# Patient Record
Sex: Female | Born: 1985 | Race: White | Hispanic: No | Marital: Single | Smoking: Never smoker
Health system: Southern US, Community
[De-identification: ages and names within clinical notes are randomized; demographics above are authoritative.]

## PROBLEM LIST (undated history)

## (undated) DIAGNOSIS — R87629 Unspecified abnormal cytological findings in specimens from vagina: Secondary | ICD-10-CM

## (undated) DIAGNOSIS — Z8619 Personal history of other infectious and parasitic diseases: Secondary | ICD-10-CM

## (undated) DIAGNOSIS — A609 Anogenital herpesviral infection, unspecified: Secondary | ICD-10-CM

## (undated) DIAGNOSIS — I8390 Asymptomatic varicose veins of unspecified lower extremity: Secondary | ICD-10-CM

## (undated) DIAGNOSIS — R51 Headache: Secondary | ICD-10-CM

## (undated) DIAGNOSIS — K5090 Crohn's disease, unspecified, without complications: Secondary | ICD-10-CM

## (undated) DIAGNOSIS — A64 Unspecified sexually transmitted disease: Secondary | ICD-10-CM

## (undated) DIAGNOSIS — R519 Headache, unspecified: Secondary | ICD-10-CM

## (undated) DIAGNOSIS — F419 Anxiety disorder, unspecified: Secondary | ICD-10-CM

## (undated) HISTORY — DX: Anogenital herpesviral infection, unspecified: A60.9

## (undated) HISTORY — DX: Anxiety disorder, unspecified: F41.9

## (undated) HISTORY — DX: Unspecified abnormal cytological findings in specimens from vagina: R87.629

## (undated) HISTORY — DX: Headache: R51

## (undated) HISTORY — DX: Unspecified sexually transmitted disease: A64

## (undated) HISTORY — PX: ANKLE SURGERY: SHX546

## (undated) HISTORY — PX: TONSILLECTOMY: SUR1361

## (undated) HISTORY — DX: Personal history of other infectious and parasitic diseases: Z86.19

## (undated) HISTORY — DX: Headache, unspecified: R51.9

## (undated) HISTORY — DX: Asymptomatic varicose veins of unspecified lower extremity: I83.90

---

## 2009-08-30 ENCOUNTER — Emergency Department (HOSPITAL_BASED_OUTPATIENT_CLINIC_OR_DEPARTMENT_OTHER): Admission: EM | Admit: 2009-08-30 | Discharge: 2009-08-31 | Payer: Self-pay | Admitting: Emergency Medicine

## 2009-08-30 ENCOUNTER — Ambulatory Visit: Payer: Self-pay | Admitting: Diagnostic Radiology

## 2010-09-20 LAB — URINE MICROSCOPIC-ADD ON

## 2010-09-20 LAB — URINALYSIS, ROUTINE W REFLEX MICROSCOPIC
Leukocytes, UA: NEGATIVE
Specific Gravity, Urine: 1.03 (ref 1.005–1.030)
Urobilinogen, UA: 1 mg/dL (ref 0.0–1.0)

## 2010-09-20 LAB — COMPREHENSIVE METABOLIC PANEL
AST: 44 U/L — ABNORMAL HIGH (ref 0–37)
Albumin: 4.9 g/dL (ref 3.5–5.2)
Alkaline Phosphatase: 79 U/L (ref 39–117)
BUN: 10 mg/dL (ref 6–23)
Calcium: 9.5 mg/dL (ref 8.4–10.5)
GFR calc Af Amer: >60 mL/min (ref >60)
Potassium: 3.9 mEq/L (ref 3.5–5.1)
Sodium: 141 mEq/L (ref 135–145)
Total Bilirubin: 0.7 mg/dL (ref 0.3–1.2)
Total Protein: 8.3 g/dL (ref 6.0–8.3)

## 2010-09-20 LAB — DIFFERENTIAL
Basophils Absolute: 0.1 10*3/uL (ref 0.0–0.1)
Basophils Relative: 1 % (ref 0–1)
Eosinophils Absolute: 0 10*3/uL (ref 0.0–0.7)
Eosinophils Relative: 0 % (ref 0–5)

## 2010-09-20 LAB — CBC
MCV: 88.3 fL (ref 78.0–100.0)
WBC: 5.8 10*3/uL (ref 4.0–10.5)

## 2010-09-20 LAB — PREGNANCY, URINE: Preg Test, Ur: NEGATIVE

## 2012-11-07 ENCOUNTER — Telehealth: Payer: Self-pay | Admitting: Nurse Practitioner

## 2012-11-07 NOTE — Telephone Encounter (Signed)
Left message to return call to our office concerning question of ocp.

## 2012-11-07 NOTE — Telephone Encounter (Signed)
Pt having problems with her birth control. Please call to advise.

## 2012-11-09 NOTE — Telephone Encounter (Signed)
Left message on CB# to return call to our office concerning question of OCP.

## 2012-11-14 ENCOUNTER — Telehealth: Payer: Self-pay | Admitting: Nurse Practitioner

## 2012-11-14 NOTE — Telephone Encounter (Signed)
Patient called concern of currently on birth control Yaz. Had read where it could cause spider veins. Last nite she notice an area on leg with pain and throbbing and today has first varicose vein. Request to change to a birth control that would not cause this to happen. Please advise.

## 2012-11-14 NOTE — Telephone Encounter (Signed)
Patient called to discuss blood clots.

## 2012-11-16 ENCOUNTER — Other Ambulatory Visit: Payer: Self-pay | Admitting: Orthopedic Surgery

## 2012-11-16 MED ORDER — DESOGESTREL-ETHINYL ESTRADIOL 0.15-0.02/0.01 MG (21/5) PO TABS: 1.0000 | ORAL_TABLET | Freq: Every day | ORAL | Status: DC

## 2012-11-16 NOTE — Telephone Encounter (Signed)
Spoke with pt about PG recommendation. Pt willing to try Mircette. Advised to try for a few months and call back if problems. Advised she may have some spotting or BTB when starting a new pill. Pt agreeable.

## 2012-11-16 NOTE — Telephone Encounter (Signed)
Any OCP can increase risk of DVT; however her complaints seem to be related to superficial varicosities. This can be related to family inherited problem or from OCP.  Unfortunately any pill may do the same thing.  Less risk factors if not smoking.   We could change and see what happens.  She has been on Aviane and had mood changes, now Yaz.  Lets try a different pill family like Mircette.  Ok to give RX until Jan 2015.  If she doesn't like this pill or problems to call back.  There is still an adjustment period when changes types of pills.

## 2012-12-07 ENCOUNTER — Ambulatory Visit (INDEPENDENT_AMBULATORY_CARE_PROVIDER_SITE_OTHER): Payer: BC Managed Care – PPO | Admitting: Obstetrics & Gynecology

## 2012-12-07 ENCOUNTER — Telehealth: Payer: Self-pay | Admitting: Nurse Practitioner

## 2012-12-07 ENCOUNTER — Encounter: Payer: Self-pay | Admitting: Obstetrics & Gynecology

## 2012-12-07 VITALS — BP 110/78 | HR 72 | Resp 16 | Ht 65.5 in | Wt 158.0 lb

## 2012-12-07 DIAGNOSIS — N760 Acute vaginitis: Secondary | ICD-10-CM

## 2012-12-07 DIAGNOSIS — N898 Other specified noninflammatory disorders of vagina: Secondary | ICD-10-CM

## 2012-12-07 DIAGNOSIS — A499 Bacterial infection, unspecified: Secondary | ICD-10-CM

## 2012-12-07 MED ORDER — METRONIDAZOLE 500 MG PO TABS: 500.0000 mg | ORAL_TABLET | Freq: Two times a day (BID) | ORAL | Status: DC

## 2012-12-07 MED ORDER — METRONIDAZOLE 0.75 % VA GEL: 1.0000 | Freq: Every day | VAGINAL | Status: DC

## 2012-12-07 MED ORDER — FLUCONAZOLE 150 MG PO TABS: 150.0000 mg | ORAL_TABLET | Freq: Once | ORAL | Status: DC

## 2012-12-07 NOTE — Telephone Encounter (Signed)
Patient has called again requesting medication for Bacterial Vaginosis. States she knows the symptoms and has history of this and requires medication for. Please advise. sue

## 2012-12-07 NOTE — Telephone Encounter (Signed)
Pt would like to have something called in for an infection if possible.

## 2012-12-07 NOTE — Progress Notes (Signed)
Subjective:     Patient ID: Chelsey Alvarado, female   DOB: 01-20-1986, 27 y.o.   MRN: [id number]  HPI 27 yo G0 with three days of increasing vaginal discharge and odor.  Pt reports long hx of recurrent BV until she started on Yaz.  She did really well on Yaz with improved acne, decreased cycles, and resolution of BV.  Reports earlier this year she started having "vein problems" caused by the Yaz.  She states her veins started to swell.  She saw provider at [facility] [place] in [place].  From her description, a doppler was done that was negative.  She is to wear compression hose for three months and then have an ultrasound done--vein mapping?  She was told the vein issues were due to the Yaz.  She wants to restart it.    In review of old paper chart, patient also had "issues" with Gardisil vaccine.  We did an adverse reaction report for her, although reaction was not felt due to Gardisil.     Review of Systems No urinary symptoms.  No fever.  No back pain.  No bowel issues.    Objective:   Physical Exam  Constitutional: She is oriented to person, place, and time. She appears well-developed and well-nourished.  Abdominal: Soft. Bowel sounds are normal.  Genitourinary: Uterus normal. There is no rash or lesion on the right labia. There is no rash or lesion on the left labia. Cervix exhibits no motion tenderness, no discharge and no friability. Right adnexum displays no mass and no tenderness. Left adnexum displays no mass and no tenderness. No bleeding around the vagina. No signs of injury around the vagina. Vaginal discharge (whitish with odor) found.  Wet smear:  Ph 4.5.  Saline with Clue cells, no trich. KOH with occasional yeast  Neurological: She is alert and oriented to person, place, and time.  Skin: Skin is warm and dry.       Assessment:     Vaginal discharge consistent with BV     Plan:     Patient requests both Oral and vaginal treatments for BV.  RX to pharmacy. Diflucan  150mg  po x 1, repeat 48 hrs if needed for yeast Request records from [facility] to review if Yaz appropriate or not safe.

## 2012-12-07 NOTE — Telephone Encounter (Signed)
Patient was upset when she was notified that we could not treat for BV without evaluation.  Last treated for BV over 1 year ago.  Despite patient insistence that she is very familiar with these symptoms, she is leaving the country at [facility] and she is advised (and confirmed with Dr Farrel Gobble) that she must be see for evaluation before treatment.  Patient states she is an hour away, it is [place], and leaves on Monday so I suggested that she could try Urgent Care.  Patient asks show long we are open and i offered appt with Dr Hyacinth Meeker for 4pm and patient states she will try to be here by then but may be late.  Advised we are working her in at the end of our day and if >15 mins late we will not be able to see her.

## 2012-12-07 NOTE — Patient Instructions (Addendum)
Please call with any new symptoms.

## 2012-12-07 NOTE — Telephone Encounter (Signed)
LMTCB 12/07/12 1:00pm cm

## 2012-12-20 ENCOUNTER — Telehealth: Payer: Self-pay | Admitting: Obstetrics & Gynecology

## 2012-12-20 NOTE — Telephone Encounter (Signed)
Chelsey Alvarado notified patient of referral appointment to [facility] sept 15 at 9:30Am, 10:30am appt for consult.  Will have venous doppler same day.  Message was left on voice mail.

## 2012-12-24 ENCOUNTER — Other Ambulatory Visit: Payer: Self-pay | Admitting: *Deleted

## 2012-12-24 DIAGNOSIS — I83893 Varicose veins of bilateral lower extremities with other complications: Secondary | ICD-10-CM

## 2013-01-08 ENCOUNTER — Other Ambulatory Visit: Payer: Self-pay | Admitting: Nurse Practitioner

## 2013-01-08 NOTE — Telephone Encounter (Signed)
Rx for Valtrex 1gm #30/12 refills, 1 po qd sent to pharmacy. pt is aware.

## 2013-01-08 NOTE — Telephone Encounter (Signed)
Yes OK to refill for 1 year at Valtrex 1 Gm daily # 30/12 or 3 months if insurance allows.  I failed to give Rx at last AEX or perhaps she did not need refill at that time.

## 2013-01-08 NOTE — Telephone Encounter (Signed)
Ok to refill valtrex 1gm until next aex (aex-07/18/2013)?  -valtrex 1gm, 1/2 tab po BID x3-5 days with outbreaks, then 1/2 tab po qd? Please advise. Chart on your door.

## 2013-03-04 ENCOUNTER — Other Ambulatory Visit: Payer: Self-pay | Admitting: Obstetrics & Gynecology

## 2013-03-05 NOTE — Telephone Encounter (Signed)
Please advise patient was here for OV 12/07/12 Diflucan 150 mg # 2/1rfs was sent to pharmacy.

## 2013-03-06 NOTE — Telephone Encounter (Signed)
Called patient LM on her vm to call us if symptoms do not resolve  Thanks!

## 2013-03-06 NOTE — Telephone Encounter (Signed)
rx to pharmacy.  If symptoms do not resolve, needs OV

## 2013-03-08 ENCOUNTER — Encounter: Payer: Self-pay | Admitting: Vascular Surgery

## 2013-03-11 ENCOUNTER — Encounter: Payer: Self-pay | Admitting: Vascular Surgery

## 2013-03-11 ENCOUNTER — Encounter (INDEPENDENT_AMBULATORY_CARE_PROVIDER_SITE_OTHER): Payer: BC Managed Care – PPO | Admitting: *Deleted

## 2013-03-11 ENCOUNTER — Ambulatory Visit (INDEPENDENT_AMBULATORY_CARE_PROVIDER_SITE_OTHER): Payer: BC Managed Care – PPO | Admitting: Vascular Surgery

## 2013-03-11 VITALS — BP 108/78 | HR 62 | Resp 18 | Ht 67.0 in | Wt 170.0 lb

## 2013-03-11 DIAGNOSIS — I83893 Varicose veins of bilateral lower extremities with other complications: Secondary | ICD-10-CM

## 2013-03-11 NOTE — Progress Notes (Signed)
Subjective:     Patient ID: Chelsey Alvarado, female   DOB: [birth year], 27 y.o.   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 She has noticed a prominent vein in the left posterior calf area which causes some aching discomfort as the day progresses. She is on her feet all day during the school. She is also noted some spider veins in both legs which are small. She has no history of DVT, thrombophlebitis, pulmonary embolus, stasis ulcers, or distal edema. She does wear short elastic compression stockings which did not relieve the symptoms related to the small varicosity.  Past Medical History  Diagnosis Date  . Varicose veins     History  Substance Use Topics  . Smoking status: Never Smoker   . Smokeless tobacco: Never Used  . Alcohol Use: 0.5 oz/week    1 drink(s) per week    Family History  Problem Relation Age of Onset  . Breast cancer Mother   . Emphysema Father   . Heart disease Father   . Cancer Maternal Grandmother   . Cancer Maternal Grandfather   . Cancer Paternal Grandmother   . Cancer Paternal Grandfather     Allergies  Allergen Reactions  . Aspirin   . Latex     Current outpatient prescriptions:ALPRAZolam (XANAX) 0.25 MG tablet, Take 0.25 mg by mouth at bedtime as needed for sleep., Disp: , Rfl: ;  Biotin 10 MG CAPS, Take by mouth., Disp: , Rfl: ;  fluconazole (DIFLUCAN) 150 MG tablet, TAKE 1 TABLET BY MOUTH ONCE, REPEAT IN 48 HOURS IF SYMPTOMS ARE NOT COMPLETELY RESOLVED., Disp: 2 tablet, Rfl: 0 metroNIDAZOLE (FLAGYL) 500 MG tablet, Take 1 tablet (500 mg total) by mouth 2 (two) times daily., Disp: 14 tablet, Rfl: 0;  metroNIDAZOLE (METROGEL) 0.75 % vaginal gel, Place 1 Applicatorful vaginally at bedtime., Disp: 70 g, Rfl: 0;  valACYclovir (VALTREX) 1000 MG tablet, TAKE 1 TABLET BY MOUTH EVERY DAY, Disp: 30 tablet, Rfl: 12  BP 108/78  Pulse 62  Resp 18  Ht 5\' 7"  (1.702 m)  Wt 170 lb (77.111 kg)   BMI 26.62 kg/m2  Body mass index is 26.62 kg/(m^2).           Review of Systems totally negative review of systems all systems reviewed and patient is asymptomatic    Objective:   Physical Exam BP 108/78  Pulse 62  Resp 18  Ht 5\' 7"  (1.702 m)  Wt 170 lb (77.111 kg)  BMI 26.62 kg/m2  Gen.-alert and oriented x3 in no apparent distress HEENT normal for age Lungs no rhonchi or wheezing Cardiovascular regular rhythm no murmurs carotid pulses 3+ palpable no bruits audible Abdomen soft nontender no palpable masses Musculoskeletal free of  major deformities Skin clear -no rashes Neurologic normal Lower extremities 3+ femoral and dorsalis pedis pulses palpable bilaterally with no edema Small reticular vein in the left posterior calf adjacent popliteal fossa with a few tiny spider veins adjacent. No bulging varicosities. No active ulceration or hyperpigmentation or significant edema.  Today I ordered bilateral venous duplex exam which are reviewed and interpreted. There is no DVT. There is a small area of reflux in the right great saphenous vein around the knee area but it is small caliber and not to be of thought of any significance. Left leg is unremarkable.        Assessment:     Reticular vein left posterior calf with no evidence of deep  or superficial venous insufficiency and a few scattered spider veins    Plan:     Have offered patient sclerotherapy for this if she should desire she will be in touch with [place]

## 2013-03-26 ENCOUNTER — Other Ambulatory Visit: Payer: Self-pay | Admitting: Nurse Practitioner

## 2013-04-01 ENCOUNTER — Telehealth: Payer: Self-pay | Admitting: Nurse Practitioner

## 2013-04-01 NOTE — Telephone Encounter (Signed)
Chief Complaint  Patient presents with  . Medication Management    pt wants to switch birth control she is unhappy with what she is taking   Last AEX with Lauro Franklin, FNP, rx Yaz on /17/14.  Message left to return call to [place] at [phone].

## 2013-04-01 NOTE — Telephone Encounter (Signed)
Patient returning Tracy's call. °

## 2013-04-02 NOTE — Telephone Encounter (Signed)
Spoke with pt who would like to switch back to [place], as it did not cause as much bloating and crankiness as her current [place] does. Pt saw Vein Specialist and asked about Yaz affecting her bad veins. He thought it was more from wt gain than the OCP. OK to switch back to [place]?

## 2013-04-02 NOTE — Telephone Encounter (Signed)
Dr. Rondel Baton last note states she was going to review the medical records from the vein clinic since they felt the problems with varicose veins maybe from the Yaz.  I would make sure we had those records and refer this question to Dr. Hyacinth Meeker since she knows about this.

## 2013-04-02 NOTE — Telephone Encounter (Signed)
Patient is returning Tracy's call from yesterday

## 2013-04-03 MED ORDER — DROSPIRENONE-ETHINYL ESTRADIOL 3-0.02 MG PO TABS: 1.0000 | ORAL_TABLET | Freq: Every day | ORAL | Status: DC

## 2013-04-03 NOTE — Telephone Encounter (Signed)
Yes.  Fine to switch back.  Vein clinic note in Epic.  Rx done.

## 2013-04-03 NOTE — Telephone Encounter (Signed)
Dr. Hyacinth Meeker can you review and advise? Patient requests to go back on [place].

## 2013-04-03 NOTE — Telephone Encounter (Signed)
Message left to advise "The Prescription that you have requested was sent to your pharmacy if you have any questions,  return call to Frederick Klinger at [phone]."  

## 2013-05-02 ENCOUNTER — Other Ambulatory Visit: Payer: Self-pay

## 2013-07-18 ENCOUNTER — Ambulatory Visit (INDEPENDENT_AMBULATORY_CARE_PROVIDER_SITE_OTHER): Payer: BC Managed Care – PPO | Admitting: Nurse Practitioner

## 2013-07-18 ENCOUNTER — Encounter: Payer: Self-pay | Admitting: Nurse Practitioner

## 2013-07-18 ENCOUNTER — Ambulatory Visit: Payer: Self-pay | Admitting: Nurse Practitioner

## 2013-07-18 VITALS — BP 100/60 | HR 64 | Ht 65.25 in | Wt 180.0 lb

## 2013-07-18 DIAGNOSIS — Z0000 Encounter for general adult medical examination without abnormal findings: Secondary | ICD-10-CM

## 2013-07-18 DIAGNOSIS — Z01419 Encounter for gynecological examination (general) (routine) without abnormal findings: Secondary | ICD-10-CM

## 2013-07-18 LAB — POCT URINALYSIS DIPSTICK
BILIRUBIN UA: NEGATIVE
Glucose, UA: NEGATIVE
Ketones, UA: NEGATIVE
Leukocytes, UA: NEGATIVE
NITRITE UA: NEGATIVE
PROTEIN UA: NEGATIVE
Urobilinogen, UA: NEGATIVE
pH, UA: 5

## 2013-07-18 LAB — HEMOGLOBIN, FINGERSTICK: HEMOGLOBIN, FINGERSTICK: 12.5 g/dL (ref 12.0–16.0)

## 2013-07-18 MED ORDER — VALACYCLOVIR HCL 1 G PO TABS: ORAL_TABLET | ORAL | Status: DC

## 2013-07-18 MED ORDER — ADAPALENE-BENZOYL PEROXIDE 0.1-2.5 % EX GEL: 1.0000 "application " | Freq: Every day | CUTANEOUS | Status: DC

## 2013-07-18 MED ORDER — DROSPIRENONE-ETHINYL ESTRADIOL 3-0.02 MG PO TABS: 1.0000 | ORAL_TABLET | Freq: Every day | ORAL | Status: DC

## 2013-07-18 NOTE — Patient Instructions (Signed)
General topics  Next pap or exam is  due in 1 year Take a Women's multivitamin Take 1200 mg. of calcium daily - prefer dietary If any concerns in interim to call back  Breast Self-Awareness Practicing breast self-awareness may pick up problems early, prevent significant medical complications, and possibly save your life. By practicing breast self-awareness, you can become familiar with how your breasts look and feel and if your breasts are changing. This allows you to notice changes early. It can also offer you some reassurance that your breast health is good. One way to learn what is normal for your breasts and whether your breasts are changing is to do a breast self-exam. If you find a lump or something that was not present in the past, it is best to contact your caregiver right away. Other findings that should be evaluated by your caregiver include nipple discharge, especially if it is bloody; skin changes or reddening; areas where the skin seems to be pulled in (retracted); or new lumps and bumps. Breast pain is seldom associated with cancer (malignancy), but should also be evaluated by a caregiver. BREAST SELF-EXAM The best time to examine your breasts is 5 7 days after your menstrual period is over.  ExitCare Patient Information 2013 [employer].   Exercise to Stay Healthy Exercise helps you become and stay healthy. EXERCISE IDEAS AND TIPS Choose exercises that:  You enjoy.  Fit into your day. You do not need to exercise really hard to be healthy. You can do exercises at a slow or medium level and stay healthy. You can:  Stretch before and after working out.  Try yoga, Pilates, or tai chi.  Lift weights.  Walk fast, swim, jog, run, climb stairs, bicycle, dance, or rollerskate.  Take aerobic classes. Exercises that burn about 150 calories:  Running 1  miles in 15 minutes.  Playing volleyball for 45 to 60 minutes.  Washing and waxing a car for 45 to 60  minutes.  Playing touch football for 45 minutes.  Walking 1  miles in 35 minutes.  Pushing a stroller 1  miles in 30 minutes.  Playing basketball for 30 minutes.  Raking leaves for 30 minutes.  Bicycling 5 miles in 30 minutes.  Walking 2 miles in 30 minutes.  Dancing for 30 minutes.  Shoveling snow for 15 minutes.  Swimming laps for 20 minutes.  Walking up stairs for 15 minutes.  Bicycling 4 miles in 15 minutes.  Gardening for 30 to 45 minutes.  Jumping rope for 15 minutes.  Washing windows or floors for 45 to 60 minutes. Document Released: 07/16/2010 Document Revised: 09/05/2011 Document Reviewed: 07/16/2010 ExitCare Patient Information 2013 [employer].   Other topics ( that may be useful information):    Sexually Transmitted Disease Sexually transmitted disease (STD) refers to any infection that is passed from person to person during sexual activity. This may happen by way of saliva, semen, blood, vaginal mucus, or urine. Common STDs include:  Gonorrhea.  Chlamydia.  Syphilis.  HIV/AIDS.  Genital herpes.  Hepatitis B and C.  Trichomonas.  Human papillomavirus (HPV).  Pubic lice. CAUSES  An STD may be spread by bacteria, virus, or parasite. A person can get an STD by:  Sexual intercourse with an infected person.  Sharing sex toys with an infected person.  Sharing needles with an infected person.  Having intimate contact with the genitals, mouth, or rectal areas of an infected person. SYMPTOMS  Some people may not have any symptoms, but   they can still pass the infection to others. Different STDs have different symptoms. Symptoms include:  Painful or bloody urination.  Pain in the pelvis, abdomen, vagina, anus, throat, or eyes.  Skin rash, itching, irritation, growths, or sores (lesions). These usually occur in the genital or anal area.  Abnormal vaginal discharge.  Penile discharge in men.  Soft, flesh-colored skin growths in the  genital or anal area.  Fever.  Pain or bleeding during sexual intercourse.  Swollen glands in the groin area.  Yellow skin and eyes (jaundice). This is seen with hepatitis. DIAGNOSIS  To make a diagnosis, your caregiver may:  Take a medical history.  Perform a physical exam.  Take a specimen (culture) to be examined.  Examine a sample of discharge under a microscope.  Perform blood test TREATMENT   Chlamydia, gonorrhea, trichomonas, and syphilis can be cured with antibiotic medicine.  Genital herpes, hepatitis, and HIV can be treated, but not cured, with prescribed medicines. The medicines will lessen the symptoms.  Genital warts from HPV can be treated with medicine or by freezing, burning (electrocautery), or surgery. Warts may come back.  HPV is a virus and cannot be cured with medicine or surgery.However, abnormal areas may be followed very closely by your caregiver and may be removed from the cervix, vagina, or vulva through office procedures or surgery. If your diagnosis is confirmed, your recent sexual partners need treatment. This is true even if they are symptom-free or have a negative culture or evaluation. They should not have sex until their caregiver says it is okay. HOME CARE INSTRUCTIONS  All sexual partners should be informed, tested, and treated for all STDs.  Take your antibiotics as directed. Finish them even if you start to feel better.  Only take over-the-counter or prescription medicines for pain, discomfort, or fever as directed by your caregiver.  Rest.  Eat a balanced diet and drink enough fluids to keep your urine clear or pale yellow.  Do not have sex until treatment is completed and you have followed up with your caregiver. STDs should be checked after treatment.  Keep all follow-up appointments, Pap tests, and blood tests as directed by your caregiver.  Only use latex condoms and water-soluble lubricants during sexual activity. Do not use  petroleum jelly or oils.  Avoid alcohol and illegal drugs.  Get vaccinated for HPV and hepatitis. If you have not received these vaccines in the past, talk to your caregiver about whether one or both might be right for you.  Avoid risky sex practices that can break the skin. The only way to avoid getting an STD is to avoid all sexual activity.Latex condoms and dental dams (for oral sex) will help lessen the risk of getting an STD, but will not completely eliminate the risk. SEEK MEDICAL CARE IF:   You have a fever.  You have any new or worsening symptoms. Document Released: 09/03/2002 Document Revised: 09/05/2011 Document Reviewed: 09/10/2010 [facility] Patient Information 2013 [employer].    Domestic Abuse You are being battered or abused if someone close to you hits, pushes, or physically hurts you in any way. You also are being abused if you are forced into activities. You are being sexually abused if you are forced to have sexual contact of any kind. You are being emotionally abused if you are made to feel worthless or if you are constantly threatened. It is important to remember that help is available. No one has the right to abuse you. PREVENTION OF FURTHER  ABUSE  Learn the warning signs of danger. This varies with situations but may include: the use of alcohol, threats, isolation from friends and family, or forced sexual contact. Leave if you feel that violence is going to occur.  If you are attacked or beaten, report it to the police so the abuse is documented. You do not have to press charges. The police can protect you while you or the attackers are leaving. Get the officer's name and badge number and a copy of the report.  Find someone you can trust and tell them what is happening to you: your caregiver, a nurse, clergy member, close friend or family member. Feeling ashamed is natural, but remember that you have done nothing wrong. No one deserves abuse. Document Released:  06/10/2000 Document Revised: 09/05/2011 Document Reviewed: 08/19/2010 ExitCare Patient Information 2013 [employer].    How Much is Too Much Alcohol? Drinking too much alcohol can cause injury, accidents, and health problems. These types of problems can include:   Car crashes.  Falls.  Family fighting (domestic violence).  Drowning.  Fights.  Injuries.  Burns.  Damage to certain organs.  Having a baby with birth defects. ONE DRINK CAN BE TOO MUCH WHEN YOU ARE:  Working.  Pregnant or breastfeeding.  Taking medicines. Ask your doctor.  Driving or planning to drive. If you or someone you know has a drinking problem, get help from a doctor.  Document Released: 04/09/2009 Document Revised: 09/05/2011 Document Reviewed: 04/09/2009 ExitCare Patient Information 2013 [employer].   Smoking Hazards Smoking cigarettes is extremely bad for your health. Tobacco smoke has over 200 known poisons in it. There are over 60 chemicals in tobacco smoke that cause cancer. Some of the chemicals found in cigarette smoke include:   Cyanide.  Benzene.  Formaldehyde.  Methanol (wood alcohol).  Acetylene (fuel used in welding torches).  Ammonia. Cigarette smoke also contains the poisonous gases nitrogen oxide and carbon monoxide.  Cigarette smokers have an increased risk of many serious medical problems and Smoking causes approximately:  90% of all lung cancer deaths in men.  80% of all lung cancer deaths in women.  90% of deaths from chronic obstructive lung disease. Compared with nonsmokers, smoking increases the risk of:  Coronary heart disease by 2 to 4 times.  Stroke by 2 to 4 times.  Men developing lung cancer by 23 times.  Women developing lung cancer by 13 times.  Dying from chronic obstructive lung diseases by 12 times.  . Smoking is the most preventable cause of death and disease in our society.  WHY IS SMOKING ADDICTIVE?  Nicotine is the chemical  agent in tobacco that is capable of causing addiction or dependence.  When you smoke and inhale, nicotine is absorbed rapidly into the bloodstream through your lungs. Nicotine absorbed through the lungs is capable of creating a powerful addiction. Both inhaled and non-inhaled nicotine may be addictive.  Addiction studies of cigarettes and spit tobacco show that addiction to nicotine occurs mainly during the teen years, when young people begin using tobacco products. WHAT ARE THE BENEFITS OF QUITTING?  There are many health benefits to quitting smoking.   Likelihood of developing cancer and heart disease decreases. Health improvements are seen almost immediately.  Blood pressure, pulse rate, and breathing patterns start returning to normal soon after quitting. QUITTING SMOKING   American Lung Association - 1-800-LUNGUSA  American Cancer Society - 1-800-ACS-2345 Document Released: 07/21/2004 Document Revised: 09/05/2011 Document Reviewed: 03/25/2009 ExitCare Patient Information 2013 ExitCare[employer]   LLC.   Stress Management Stress is a state of physical or mental tension that often results from changes in your life or normal routine. Some common causes of stress are:  Death of a loved one.  Injuries or severe illnesses.  Getting fired or changing jobs.  Moving into a new home. Other causes may be:  Sexual problems.  Business or financial losses.  Taking on a large debt.  Regular conflict with someone at home or at work.  Constant tiredness from lack of sleep. It is not just bad things that are stressful. It may be stressful to:  Win the lottery.  Get married.  Buy a new car. The amount of stress that can be easily tolerated varies from person to person. Changes generally cause stress, regardless of the types of change. Too much stress can affect your health. It may lead to physical or emotional problems. Too little stress (boredom) may also become stressful. SUGGESTIONS TO  REDUCE STRESS:  Talk things over with your family and friends. It often is helpful to share your concerns and worries. If you feel your problem is serious, you may want to get help from a professional counselor.  Consider your problems one at a time instead of lumping them all together. Trying to take care of everything at once may seem impossible. List all the things you need to do and then start with the most important one. Set a goal to accomplish 2 or 3 things each day. If you expect to do too many in a single day you will naturally fail, causing you to feel even more stressed.  Do not use alcohol or drugs to relieve stress. Although you may feel better for a short time, they do not remove the problems that caused the stress. They can also be habit forming.  Exercise regularly - at least 3 times per week. Physical exercise can help to relieve that "uptight" feeling and will relax you.  The shortest distance between despair and hope is often a good night's sleep.  Go to bed and get up on time allowing yourself time for appointments without being rushed.  Take a short "time-out" period from any stressful situation that occurs during the day. Close your eyes and take some deep breaths. Starting with the muscles in your face, tense them, hold it for a few seconds, then relax. Repeat this with the muscles in your neck, shoulders, hand, stomach, back and legs.  Take good care of yourself. Eat a balanced diet and get plenty of rest.  Schedule time for having fun. Take a break from your daily routine to relax. HOME CARE INSTRUCTIONS   Call if you feel overwhelmed by your problems and feel you can no longer manage them on your own.  Return immediately if you feel like hurting yourself or someone else. Document Released: 12/07/2000 Document Revised: 09/05/2011 Document Reviewed: 07/30/2007 ExitCare Patient Information 2013 [employer]Care[employer].  

## 2013-07-18 NOTE — Progress Notes (Signed)
Patient ID: Chelsey Alvarado, female   DOB: 12-09-1985, 28 y.o.   MRN: [id number] 28 y.o. G0P0 Single Caucasian Fe here for annual exam.  Not currently SA in 1 + years.  Menses last 5 days. Some PMS.  No headaches.  Patient's last menstrual period was 07/10/2013.          Sexually active: no  The current method of family planning is oral progesterone-only contraceptive.    Exercising: yes  Home exercise routine includes walking. Smoker:  no  Health Maintenance: Pap:  07/13/12 TDaP:  ? 2009 Gardasil:  Completed in 2013 Labs: HB:  12.5 Urine:  Trace RBC, pH 5.0   reports that she has never smoked. She has never used smokeless tobacco. She reports that she drinks about 0.5 ounces of alcohol per week. She reports that she does not use illicit drugs.  Past Medical History  Diagnosis Date  . Varicose veins   . STD (sexually transmitted disease)     HSV    Past Surgical History  Procedure Laterality Date  . Tonsillectomy      Current Outpatient Prescriptions  Medication Sig Dispense Refill  . ALPRAZolam (XANAX) 0.25 MG tablet Take 0.25 mg by mouth at bedtime as needed for sleep.      . drospirenone-ethinyl estradiol (YAZ,GIANVI,LORYNA) 3-0.02 MG tablet Take 1 tablet by mouth daily.  1 Package  3  . Prenatal Vit-Fe Fumarate-FA (PRENATAL MULTIVITAMIN) TABS tablet Take 1 tablet by mouth daily at 12 noon.      . valACYclovir (VALTREX) 1000 MG tablet TAKE 1 TABLET BY MOUTH EVERY DAY  30 tablet  12   No current facility-administered medications for this visit.    Family History  Problem Relation Age of Onset  . Breast cancer Mother   . Emphysema Father   . Heart disease Father   . Cancer Maternal Grandmother   . Cancer Maternal Grandfather   . Cancer Paternal Grandmother   . Cancer Paternal Grandfather     ROS:  Pertinent items are noted in HPI.  Otherwise, a comprehensive ROS was negative.  Exam:   BP 100/60  Pulse 64  Ht 5' 5.25" (1.657 m)  Wt 180 lb (81.647 kg)  BMI 29.74  kg/m2  LMP 07/10/2013 Height: 5' 5.25" (165.7 cm)  Ht Readings from Last 3 Encounters:  07/18/13 5' 5.25" (1.657 m)  03/11/13 5\' 7"  (1.702 m)  12/07/12 5' 5.5" (1.664 m)    General appearance: alert, cooperative and appears stated age Head: Normocephalic, without obvious abnormality, atraumatic Neck: no adenopathy, supple, symmetrical, trachea midline and thyroid normal to inspection and palpation Lungs: clear to auscultation bilaterally Breasts: normal appearance, no masses or tenderness Heart: regular rate and rhythm Abdomen: soft, non-tender; no masses,  no organomegaly Extremities: extremities normal, atraumatic, no cyanosis or edema Skin: Skin color, texture, turgor normal. No rashes or lesions Lymph nodes: Cervical, supraclavicular, and axillary nodes normal. No abnormal inguinal nodes palpated Neurologic: Grossly normal   Pelvic: External genitalia:  no lesions              Urethra:  normal appearing urethra with no masses, tenderness or lesions              Bartholin's and Skene's: normal                 Vagina: normal appearing vagina with normal color and discharge, no lesions              Cervix: anteverted  Pap taken: no Bimanual Exam:  Uterus:  normal size, contour, position, consistency, mobility, non-tender              Adnexa: no mass, fullness, tenderness               Rectovaginal: Confirms               Anus:  normal sphincter tone, no lesions  A:  Well Woman with normal exam  Contraception  Acne    P:   Pap smear as per guidelines   Refill Yaz for a year  Refill Epi duo X 2  Counseled on breast self exam, STD prevention, use and side effects of OCP's, adequate intake of calcium and vitamin D, diet and exercise return annually or prn  An After Visit Summary was printed and given to the patient.

## 2013-07-19 NOTE — Progress Notes (Signed)
Encounter reviewed by Dr. Khilynn Borntreger Silva.  

## 2013-11-11 ENCOUNTER — Telehealth: Payer: Self-pay | Admitting: Obstetrics & Gynecology

## 2013-11-11 ENCOUNTER — Ambulatory Visit (INDEPENDENT_AMBULATORY_CARE_PROVIDER_SITE_OTHER): Payer: BC Managed Care – PPO | Admitting: Certified Nurse Midwife

## 2013-11-11 ENCOUNTER — Encounter: Payer: Self-pay | Admitting: Certified Nurse Midwife

## 2013-11-11 VITALS — BP 100/60 | HR 84 | Temp 98.2°F | Ht 65.25 in | Wt 166.0 lb

## 2013-11-11 DIAGNOSIS — N390 Urinary tract infection, site not specified: Secondary | ICD-10-CM

## 2013-11-11 DIAGNOSIS — N760 Acute vaginitis: Secondary | ICD-10-CM

## 2013-11-11 MED ORDER — FLUCONAZOLE 150 MG PO TABS: 150.0000 mg | ORAL_TABLET | Freq: Once | ORAL | Status: DC

## 2013-11-11 MED ORDER — NITROFURANTOIN MONOHYD MACRO 100 MG PO CAPS: 100.0000 mg | ORAL_CAPSULE | Freq: Two times a day (BID) | ORAL | Status: DC

## 2013-11-11 NOTE — Telephone Encounter (Signed)
Opened in error

## 2013-11-11 NOTE — Patient Instructions (Signed)
Monilial Vaginitis Vaginitis in a soreness, swelling and redness (inflammation) of the vagina and vulva. Monilial vaginitis is not a sexually transmitted infection. CAUSES  Yeast vaginitis is caused by yeast (candida) that is normally found in your vagina. With a yeast infection, the candida has overgrown in number to a point that upsets the chemical balance. SYMPTOMS   White, thick vaginal discharge.  Swelling, itching, redness and irritation of the vagina and possibly the lips of the vagina (vulva).  Burning or painful urination.  Painful intercourse. DIAGNOSIS  Things that may contribute to monilial vaginitis are:  Postmenopausal and virginal states.  Pregnancy.  Infections.  Being tired, sick or stressed, especially if you had monilial vaginitis in the past.  Diabetes. Good control will help lower the chance.  Birth control pills.  Tight fitting garments.  Using bubble bath, feminine sprays, douches or deodorant tampons.  Taking certain medications that kill germs (antibiotics).  Sporadic recurrence can occur if you become ill. TREATMENT  Your caregiver will give you medication.  There are several kinds of anti monilial vaginal creams and suppositories specific for monilial vaginitis. For recurrent yeast infections, use a suppository or cream in the vagina 2 times a week, or as directed.  Anti-monilial or steroid cream for the itching or irritation of the vulva may also be used. Get your caregiver's permission.  Painting the vagina with methylene blue solution may help if the monilial cream does not work.  Eating yogurt may help prevent monilial vaginitis. HOME CARE INSTRUCTIONS   Finish all medication as prescribed.  Do not have sex until treatment is completed or after your caregiver tells you it is okay.  Take warm sitz baths.  Do not douche.  Do not use tampons, especially scented ones.  Wear cotton underwear.  Avoid tight pants and panty  hose.  Tell your sexual partner that you have a yeast infection. They should go to their caregiver if they have symptoms such as mild rash or itching.  Your sexual partner should be treated as well if your infection is difficult to eliminate.  Practice safer sex. Use condoms.  Some vaginal medications cause latex condoms to fail. Vaginal medications that harm condoms are:  Cleocin cream.  Butoconazole (Femstat).  Terconazole (Terazol) vaginal suppository.  Miconazole (Monistat) (may be purchased over the counter). SEEK MEDICAL CARE IF:   You have a temperature by mouth above 102 F (38.9 C).  The infection is getting worse after 2 days of treatment.  The infection is not getting better after 3 days of treatment.  You develop blisters in or around your vagina.  You develop vaginal bleeding, and it is not your menstrual period.  You have pain when you urinate.  You develop intestinal problems.  You have pain with sexual intercourse. Document Released: 03/23/2005 Document Revised: 09/05/2011 Document Reviewed: 12/05/2008 ExitCare Patient Information 2014 [employer]. Bacterial Vaginosis Bacterial vaginosis is a vaginal infection that occurs when the normal balance of bacteria in the vagina is disrupted. It results from an overgrowth of certain bacteria. This is the most common vaginal infection in women of childbearing age. Treatment is important to prevent complications, especially in pregnant women, as it can cause a premature delivery. CAUSES  Bacterial vaginosis is caused by an increase in harmful bacteria that are normally present in smaller amounts in the vagina. Several different kinds of bacteria can cause bacterial vaginosis. However, the reason that the condition develops is not fully understood. RISK FACTORS Certain activities or behaviors can put   you at an increased risk of developing bacterial vaginosis, including:  Having a new sex partner or multiple sex  partners.  Douching.  Using an intrauterine device (IUD) for contraception. Women do not get bacterial vaginosis from toilet seats, bedding, swimming pools, or contact with objects around them. SIGNS AND SYMPTOMS  Some women with bacterial vaginosis have no signs or symptoms. Common symptoms include:  Grey vaginal discharge.  A fishlike odor with discharge, especially after sexual intercourse.  Itching or burning of the vagina and vulva.  Burning or pain with urination. DIAGNOSIS  Your health care provider will take a medical history and examine the vagina for signs of bacterial vaginosis. A sample of vaginal fluid may be taken. Your health care provider will look at this sample under a microscope to check for bacteria and abnormal cells. A vaginal pH test may also be done.  TREATMENT  Bacterial vaginosis may be treated with antibiotic medicines. These may be given in the form of a pill or a vaginal cream. A second round of antibiotics may be prescribed if the condition comes back after treatment.  HOME CARE INSTRUCTIONS   Only take over-the-counter or prescription medicines as directed by your health care provider.  If antibiotic medicine was prescribed, take it as directed. Make sure you finish it even if you start to feel better.  Do not have sex until treatment is completed.  Tell all sexual partners that you have a vaginal infection. They should see their health care provider and be treated if they have problems, such as a mild rash or itching.  Practice safe sex by using condoms and only having one sex partner. SEEK MEDICAL CARE IF:   Your symptoms are not improving after 3 days of treatment.  You have increased discharge or pain.  You have a fever. MAKE SURE YOU:   Understand these instructions.  Will watch your condition.  Will get help right away if you are not doing well or get worse. FOR MORE INFORMATION  Centers for Disease Control and Prevention, Division of  STD Prevention: www.cdc.gov/std American Sexual Health Association (ASHA): www.ashastd.org  Document Released: 06/13/2005 Document Revised: 04/03/2013 Document Reviewed: 01/23/2013 ExitCare Patient Information 2014 [employer].  

## 2013-11-11 NOTE — Progress Notes (Signed)
28 y.o. single white g1p1 here with complaint of vaginal symptoms of itching, burning, and increase discharge. Describes discharge as white, green thick discharge without odor. Patient has treated with Monistat 3 day with some relief. Onset of symptoms 5 days ago. Denies new personal products. No STD concerns. Urinary symptoms frequency only . No fever or chills or back pain. Contraception OCP and condoms. 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 No partner change, engaged.  O:Healthy female WDWN Affect: normal, orientation x 3  Exam:Skin: warm and dry Abdomen: positive suprapubic CVAT: bilateral negative Lymph node: no enlargement or tenderness Pelvic exam: External genital: shaved, no lesions or redness BUS: negative Bladder and urethral meatus tender Vagina: white odorous watery  discharge noted. Ph:5.5   Wet prep taken Cervix: normal, non tender, no blood noted Uterus: normal, non tender Adnexa:normal, non tender, no masses or fullness noted  Wet Prep results:negative for yeast, positive for BV  A: BV UTI per symptoms History of yeast  P:Discussed findings of BV and etiology. Discussed Aveeno or baking soda sitz bath for comfort. Avoid moist clothes or pads for extended period of time. If working out in gym clothes or swim suits for long periods of time change underwear or bottoms of swimsuit if possible. Olive Oil use for skin protection prior to activity can be used to external skin. Rx: Metrogel patient has so no RX sent Discussed findings consistent with UTI.  Rx Macrobid see order with instructions. Increase water intake and decrease soda, tea and coffee intake. Aware of UTI warning signs and need to advise. EVO:JJKKX culture  Rv prn

## 2013-11-11 NOTE — Progress Notes (Signed)
Reviewed personally.  M. Suzanne Franko Hilliker, MD.  

## 2013-11-12 LAB — URINE CULTURE

## 2013-11-28 ENCOUNTER — Telehealth: Payer: Self-pay | Admitting: Nurse Practitioner

## 2013-11-28 NOTE — Telephone Encounter (Signed)
error 

## 2013-11-29 ENCOUNTER — Ambulatory Visit (INDEPENDENT_AMBULATORY_CARE_PROVIDER_SITE_OTHER): Payer: BC Managed Care – PPO | Admitting: Nurse Practitioner

## 2013-11-29 ENCOUNTER — Encounter: Payer: Self-pay | Admitting: Nurse Practitioner

## 2013-11-29 VITALS — BP 110/68 | HR 76 | Ht 65.25 in | Wt 160.0 lb

## 2013-11-29 DIAGNOSIS — N760 Acute vaginitis: Secondary | ICD-10-CM

## 2013-11-29 DIAGNOSIS — N926 Irregular menstruation, unspecified: Secondary | ICD-10-CM

## 2013-11-29 DIAGNOSIS — B9689 Other specified bacterial agents as the cause of diseases classified elsewhere: Secondary | ICD-10-CM

## 2013-11-29 DIAGNOSIS — A499 Bacterial infection, unspecified: Secondary | ICD-10-CM

## 2013-11-29 DIAGNOSIS — R350 Frequency of micturition: Secondary | ICD-10-CM

## 2013-11-29 LAB — POCT URINALYSIS DIPSTICK
Bilirubin, UA: NEGATIVE
Glucose, UA: NEGATIVE
KETONES UA: NEGATIVE
LEUKOCYTES UA: NEGATIVE
Nitrite, UA: NEGATIVE
PROTEIN UA: NEGATIVE
UROBILINOGEN UA: NEGATIVE
pH, UA: 7

## 2013-11-29 LAB — POCT URINE PREGNANCY: PREG TEST UR: NEGATIVE

## 2013-11-29 MED ORDER — FLUCONAZOLE 150 MG PO TABS
150.0000 mg | ORAL_TABLET | Freq: Once | ORAL | Status: DC
Start: 2013-11-29 — End: 2014-09-29

## 2013-11-29 MED ORDER — METRONIDAZOLE 500 MG PO TABS: 500.0000 mg | ORAL_TABLET | Freq: Two times a day (BID) | ORAL | Status: DC

## 2013-11-29 NOTE — Patient Instructions (Signed)

## 2013-11-29 NOTE — Progress Notes (Signed)
28 y.o. Single Caucasian female G0P0 with a 3 week(s) history of the following:discharge described as white and clear, local irritation, odor and urinary symptoms of urinary frequency Sexually active: yes Last sexual activity:2days ago. Pt also reports the following associated symptoms: cramps and lower back pain.   Patient has not tried over the counter treatment.  She was recently seen also for UTI and those symptoms improved until this vaginal discharge flared up again.  Same partner.  Her LMP was a little irregular in that it was lighter than normal.  Her boy friend is concerned about pregnancy     Exam:    Abdomen: soft and non tender, no flank pain    pelvic:    QIH:KVQQVZ                 DGL:OVFIEPPIR: scant, white and thin                 Cx:  normal appearance                 Uterus:normal size                 Adnexa: normal adnexa, slight tender over the bladder  Wet Prep shows:clue cells, KOH: a few yeast; Ph: 5.0; UPT: negative Urine: Trace RBC   Dx: Bacterial vaginosis  R/O UTI   TX :Symptomatic local care discussed. Transport planner distributed. Oral antifungal see orders. Oral antibiotics see orders.  Plan:  Treat with Flagyl 500 mg BID for a week caution with GI and ETOH SE  Treat with Diflucan if needed after Flagyl  Will follow with urine culture and micro

## 2013-11-30 LAB — URINALYSIS, MICROSCOPIC ONLY
BACTERIA UA: NONE SEEN
Casts: NONE SEEN
Crystals: NONE SEEN
SQUAMOUS EPITHELIAL / LPF: NONE SEEN

## 2013-12-01 LAB — URINE CULTURE
COLONY COUNT: NO GROWTH
ORGANISM ID, BACTERIA: NO GROWTH

## 2013-12-08 NOTE — Progress Notes (Signed)
Encounter reviewed by Dr. Dezaray Shibuya Silva.  

## 2014-01-16 ENCOUNTER — Other Ambulatory Visit: Payer: Self-pay

## 2014-01-16 NOTE — Telephone Encounter (Signed)
Last AEX: 07/18/13 Last refill:07/18/13 #30, 12 ref Current AEX:07/22/14  Pt has enough refills to cover until next AEX Encounter closed

## 2014-01-17 ENCOUNTER — Telehealth: Payer: Self-pay

## 2014-01-17 ENCOUNTER — Other Ambulatory Visit: Payer: Self-pay

## 2014-01-17 MED ORDER — VALACYCLOVIR HCL 1 G PO TABS: ORAL_TABLET | ORAL | Status: DC

## 2014-01-17 NOTE — Telephone Encounter (Signed)
Pt was given a new rx on 07/18/13 #30, 12 rfs. Its covers a year Encounter closed

## 2014-01-17 NOTE — Telephone Encounter (Signed)
Spoke with Ovid from [facility] in [place]. Chelsey Alvarado states that they received a fax with denial for patients Valtrex rx stating that patient was given rx with refills for 1 year in January but they have not received an rx. Upon checking records Valtrex 1000mg  #30 with 12RF was sent to [facility]. Patient needs rx sent to [facility] in [place]. Reordered rx to pharmacy of choice with #30 7RF until annual exam.  Routing to provider for final review. Patient agreeable to disposition. Will close encounter

## 2014-06-18 ENCOUNTER — Other Ambulatory Visit: Payer: Self-pay | Admitting: Nurse Practitioner

## 2014-06-18 NOTE — Telephone Encounter (Signed)
Medication refill request: Gianvi 3/0.02 mg   Last AEX:   07/18/13 with Ms. Patty Next AEX: 07/22/14 with Ms. Patty Last MMG (if hormonal medication request): N/A  Refill authorized: #3 packs with no refills, please advise.  Routed to DL since PG is out of office today.

## 2014-07-22 ENCOUNTER — Ambulatory Visit: Payer: BC Managed Care – PPO | Admitting: Nurse Practitioner

## 2014-09-29 ENCOUNTER — Ambulatory Visit (INDEPENDENT_AMBULATORY_CARE_PROVIDER_SITE_OTHER): Payer: BC Managed Care – PPO | Admitting: Nurse Practitioner

## 2014-09-29 ENCOUNTER — Encounter: Payer: Self-pay | Admitting: Nurse Practitioner

## 2014-09-29 VITALS — BP 116/74 | HR 64 | Ht 65.25 in | Wt 175.0 lb

## 2014-09-29 DIAGNOSIS — Z113 Encounter for screening for infections with a predominantly sexual mode of transmission: Secondary | ICD-10-CM | POA: Diagnosis not present

## 2014-09-29 DIAGNOSIS — Z0000 Encounter for general adult medical examination without abnormal findings: Secondary | ICD-10-CM | POA: Diagnosis not present

## 2014-09-29 DIAGNOSIS — Z3009 Encounter for other general counseling and advice on contraception: Secondary | ICD-10-CM | POA: Diagnosis not present

## 2014-09-29 DIAGNOSIS — Z01419 Encounter for gynecological examination (general) (routine) without abnormal findings: Secondary | ICD-10-CM | POA: Diagnosis not present

## 2014-09-29 LAB — POCT URINALYSIS DIPSTICK
Bilirubin, UA: NEGATIVE
Glucose, UA: NEGATIVE
KETONES UA: NEGATIVE
Leukocytes, UA: NEGATIVE
Nitrite, UA: NEGATIVE
PH UA: 6
Protein, UA: NEGATIVE
UROBILINOGEN UA: NEGATIVE

## 2014-09-29 MED ORDER — VALACYCLOVIR HCL 1 G PO TABS: ORAL_TABLET | ORAL | Status: DC

## 2014-09-29 NOTE — Patient Instructions (Signed)
General topics  Next pap or exam is  due in 1 year Take a Women's multivitamin Take 1200 mg. of calcium daily - prefer dietary If any concerns in interim to call back  Breast Self-Awareness Practicing breast self-awareness may pick up problems early, prevent significant medical complications, and possibly save your life. By practicing breast self-awareness, you can become familiar with how your breasts look and feel and if your breasts are changing. This allows you to notice changes early. It can also offer you some reassurance that your breast health is good. One way to learn what is normal for your breasts and whether your breasts are changing is to do a breast self-exam. If you find a lump or something that was not present in the past, it is best to contact your caregiver right away. Other findings that should be evaluated by your caregiver include nipple discharge, especially if it is bloody; skin changes or reddening; areas where the skin seems to be pulled in (retracted); or new lumps and bumps. Breast pain is seldom associated with cancer (malignancy), but should also be evaluated by a caregiver. BREAST SELF-EXAM The best time to examine your breasts is 5 7 days after your menstrual period is over.  ExitCare Patient Information 2013 [employer].   Exercise to Stay Healthy Exercise helps you become and stay healthy. EXERCISE IDEAS AND TIPS Choose exercises that:  You enjoy.  Fit into your day. You do not need to exercise really hard to be healthy. You can do exercises at a slow or medium level and stay healthy. You can:  Stretch before and after working out.  Try yoga, Pilates, or tai chi.  Lift weights.  Walk fast, swim, jog, run, climb stairs, bicycle, dance, or rollerskate.  Take aerobic classes. Exercises that burn about 150 calories:  Running 1  miles in 15 minutes.  Playing volleyball for 45 to 60 minutes.  Washing and waxing a car for 45 to 60  minutes.  Playing touch football for 45 minutes.  Walking 1  miles in 35 minutes.  Pushing a stroller 1  miles in 30 minutes.  Playing basketball for 30 minutes.  Raking leaves for 30 minutes.  Bicycling 5 miles in 30 minutes.  Walking 2 miles in 30 minutes.  Dancing for 30 minutes.  Shoveling snow for 15 minutes.  Swimming laps for 20 minutes.  Walking up stairs for 15 minutes.  Bicycling 4 miles in 15 minutes.  Gardening for 30 to 45 minutes.  Jumping rope for 15 minutes.  Washing windows or floors for 45 to 60 minutes. Document Released: 07/16/2010 Document Revised: 09/05/2011 Document Reviewed: 07/16/2010 ExitCare Patient Information 2013 [employer].   Other topics ( that may be useful information):    Sexually Transmitted Disease Sexually transmitted disease (STD) refers to any infection that is passed from person to person during sexual activity. This may happen by way of saliva, semen, blood, vaginal mucus, or urine. Common STDs include:  Gonorrhea.  Chlamydia.  Syphilis.  HIV/AIDS.  Genital herpes.  Hepatitis B and C.  Trichomonas.  Human papillomavirus (HPV).  Pubic lice. CAUSES  An STD may be spread by bacteria, virus, or parasite. A person can get an STD by:  Sexual intercourse with an infected person.  Sharing sex toys with an infected person.  Sharing needles with an infected person.  Having intimate contact with the genitals, mouth, or rectal areas of an infected person. SYMPTOMS  Some people may not have any symptoms, but   they can still pass the infection to others. Different STDs have different symptoms. Symptoms include:  Painful or bloody urination.  Pain in the pelvis, abdomen, vagina, anus, throat, or eyes.  Skin rash, itching, irritation, growths, or sores (lesions). These usually occur in the genital or anal area.  Abnormal vaginal discharge.  Penile discharge in men.  Soft, flesh-colored skin growths in the  genital or anal area.  Fever.  Pain or bleeding during sexual intercourse.  Swollen glands in the groin area.  Yellow skin and eyes (jaundice). This is seen with hepatitis. DIAGNOSIS  To make a diagnosis, your caregiver may:  Take a medical history.  Perform a physical exam.  Take a specimen (culture) to be examined.  Examine a sample of discharge under a microscope.  Perform blood test TREATMENT   Chlamydia, gonorrhea, trichomonas, and syphilis can be cured with antibiotic medicine.  Genital herpes, hepatitis, and HIV can be treated, but not cured, with prescribed medicines. The medicines will lessen the symptoms.  Genital warts from HPV can be treated with medicine or by freezing, burning (electrocautery), or surgery. Warts may come back.  HPV is a virus and cannot be cured with medicine or surgery.However, abnormal areas may be followed very closely by your caregiver and may be removed from the cervix, vagina, or vulva through office procedures or surgery. If your diagnosis is confirmed, your recent sexual partners need treatment. This is true even if they are symptom-free or have a negative culture or evaluation. They should not have sex until their caregiver says it is okay. HOME CARE INSTRUCTIONS  All sexual partners should be informed, tested, and treated for all STDs.  Take your antibiotics as directed. Finish them even if you start to feel better.  Only take over-the-counter or prescription medicines for pain, discomfort, or fever as directed by your caregiver.  Rest.  Eat a balanced diet and drink enough fluids to keep your urine clear or pale yellow.  Do not have sex until treatment is completed and you have followed up with your caregiver. STDs should be checked after treatment.  Keep all follow-up appointments, Pap tests, and blood tests as directed by your caregiver.  Only use latex condoms and water-soluble lubricants during sexual activity. Do not use  petroleum jelly or oils.  Avoid alcohol and illegal drugs.  Get vaccinated for HPV and hepatitis. If you have not received these vaccines in the past, talk to your caregiver about whether one or both might be right for you.  Avoid risky sex practices that can break the skin. The only way to avoid getting an STD is to avoid all sexual activity.Latex condoms and dental dams (for oral sex) will help lessen the risk of getting an STD, but will not completely eliminate the risk. SEEK MEDICAL CARE IF:   You have a fever.  You have any new or worsening symptoms. Document Released: 09/03/2002 Document Revised: 09/05/2011 Document Reviewed: 09/10/2010 [facility] Patient Information 2013 [employer].    Domestic Abuse You are being battered or abused if someone close to you hits, pushes, or physically hurts you in any way. You also are being abused if you are forced into activities. You are being sexually abused if you are forced to have sexual contact of any kind. You are being emotionally abused if you are made to feel worthless or if you are constantly threatened. It is important to remember that help is available. No one has the right to abuse you. PREVENTION OF FURTHER  ABUSE  Learn the warning signs of danger. This varies with situations but may include: the use of alcohol, threats, isolation from friends and family, or forced sexual contact. Leave if you feel that violence is going to occur.  If you are attacked or beaten, report it to the police so the abuse is documented. You do not have to press charges. The police can protect you while you or the attackers are leaving. Get the officer's name and badge number and a copy of the report.  Find someone you can trust and tell them what is happening to you: your caregiver, a nurse, clergy member, close friend or family member. Feeling ashamed is natural, but remember that you have done nothing wrong. No one deserves abuse. Document Released:  06/10/2000 Document Revised: 09/05/2011 Document Reviewed: 08/19/2010 ExitCare Patient Information 2013 [employer].    How Much is Too Much Alcohol? Drinking too much alcohol can cause injury, accidents, and health problems. These types of problems can include:   Car crashes.  Falls.  Family fighting (domestic violence).  Drowning.  Fights.  Injuries.  Burns.  Damage to certain organs.  Having a baby with birth defects. ONE DRINK CAN BE TOO MUCH WHEN YOU ARE:  Working.  Pregnant or breastfeeding.  Taking medicines. Ask your doctor.  Driving or planning to drive. If you or someone you know has a drinking problem, get help from a doctor.  Document Released: 04/09/2009 Document Revised: 09/05/2011 Document Reviewed: 04/09/2009 ExitCare Patient Information 2013 [employer].   Smoking Hazards Smoking cigarettes is extremely bad for your health. Tobacco smoke has over 200 known poisons in it. There are over 60 chemicals in tobacco smoke that cause cancer. Some of the chemicals found in cigarette smoke include:   Cyanide.  Benzene.  Formaldehyde.  Methanol (wood alcohol).  Acetylene (fuel used in welding torches).  Ammonia. Cigarette smoke also contains the poisonous gases nitrogen oxide and carbon monoxide.  Cigarette smokers have an increased risk of many serious medical problems and Smoking causes approximately:  90% of all lung cancer deaths in men.  80% of all lung cancer deaths in women.  90% of deaths from chronic obstructive lung disease. Compared with nonsmokers, smoking increases the risk of:  Coronary heart disease by 2 to 4 times.  Stroke by 2 to 4 times.  Men developing lung cancer by 23 times.  Women developing lung cancer by 13 times.  Dying from chronic obstructive lung diseases by 12 times.  . Smoking is the most preventable cause of death and disease in our society.  WHY IS SMOKING ADDICTIVE?  Nicotine is the chemical  agent in tobacco that is capable of causing addiction or dependence.  When you smoke and inhale, nicotine is absorbed rapidly into the bloodstream through your lungs. Nicotine absorbed through the lungs is capable of creating a powerful addiction. Both inhaled and non-inhaled nicotine may be addictive.  Addiction studies of cigarettes and spit tobacco show that addiction to nicotine occurs mainly during the teen years, when young people begin using tobacco products. WHAT ARE THE BENEFITS OF QUITTING?  There are many health benefits to quitting smoking.   Likelihood of developing cancer and heart disease decreases. Health improvements are seen almost immediately.  Blood pressure, pulse rate, and breathing patterns start returning to normal soon after quitting. QUITTING SMOKING   American Lung Association - 1-800-LUNGUSA  American Cancer Society - 1-800-ACS-2345 Document Released: 07/21/2004 Document Revised: 09/05/2011 Document Reviewed: 03/25/2009 ExitCare Patient Information 2013 ExitCare[employer]   LLC.   Stress Management Stress is a state of physical or mental tension that often results from changes in your life or normal routine. Some common causes of stress are:  Death of a loved one.  Injuries or severe illnesses.  Getting fired or changing jobs.  Moving into a new home. Other causes may be:  Sexual problems.  Business or financial losses.  Taking on a large debt.  Regular conflict with someone at home or at work.  Constant tiredness from lack of sleep. It is not just bad things that are stressful. It may be stressful to:  Win the lottery.  Get married.  Buy a new car. The amount of stress that can be easily tolerated varies from person to person. Changes generally cause stress, regardless of the types of change. Too much stress can affect your health. It may lead to physical or emotional problems. Too little stress (boredom) may also become stressful. SUGGESTIONS TO  REDUCE STRESS:  Talk things over with your family and friends. It often is helpful to share your concerns and worries. If you feel your problem is serious, you may want to get help from a professional counselor.  Consider your problems one at a time instead of lumping them all together. Trying to take care of everything at once may seem impossible. List all the things you need to do and then start with the most important one. Set a goal to accomplish 2 or 3 things each day. If you expect to do too many in a single day you will naturally fail, causing you to feel even more stressed.  Do not use alcohol or drugs to relieve stress. Although you may feel better for a short time, they do not remove the problems that caused the stress. They can also be habit forming.  Exercise regularly - at least 3 times per week. Physical exercise can help to relieve that "uptight" feeling and will relax you.  The shortest distance between despair and hope is often a good night's sleep.  Go to bed and get up on time allowing yourself time for appointments without being rushed.  Take a short "time-out" period from any stressful situation that occurs during the day. Close your eyes and take some deep breaths. Starting with the muscles in your face, tense them, hold it for a few seconds, then relax. Repeat this with the muscles in your neck, shoulders, hand, stomach, back and legs.  Take good care of yourself. Eat a balanced diet and get plenty of rest.  Schedule time for having fun. Take a break from your daily routine to relax. HOME CARE INSTRUCTIONS   Call if you feel overwhelmed by your problems and feel you can no longer manage them on your own.  Return immediately if you feel like hurting yourself or someone else. Document Released: 12/07/2000 Document Revised: 09/05/2011 Document Reviewed: 07/30/2007 ExitCare Patient Information 2013 [employer]Care[employer].  

## 2014-09-29 NOTE — Progress Notes (Signed)
Patient ID: Chelsey Alvarado, female   DOB: Nov 05, 1985, 29 y.o.   MRN: [id number] 29 y.o. G0P0 Single  Caucasian Fe here for annual exam.  Off Yaz since first of December.  She was given a generic and did not feel well on the pill and decided to come off.  Menses is normally off pills at 4-5 days.  Condoms for birth control.. Acne now treated with Doxycline.  No plans for future.  New partner since September 2015.  He is 15 years older.  She would like Mirena IUD.  Patient's last menstrual period was 09/25/2014 (exact date).          Sexually active: Yes.    The current method of family planning is condoms all the time.    Exercising: Yes.    cardio and weights 3-4 times per week Smoker:  no  Health Maintenance: Pap:  07/13/12 negative TDaP:  2012 Gardasil:  Completed in 2013 Labs:  HB:  12.7 Urine:  Small RBC's - on menses.   reports that she has never smoked. She has never used smokeless tobacco. She reports that she drinks about 0.5 oz of alcohol per week. She reports that she does not use illicit drugs.  Past Medical History  Diagnosis Date  . Varicose veins   . STD (sexually transmitted disease)     HSV    Past Surgical History  Procedure Laterality Date  . Tonsillectomy  age 29    Current Outpatient Prescriptions  Medication Sig Dispense Refill  . Adapalene-Benzoyl Peroxide (EPIDUO) 0.1-2.5 % gel Apply 1 application topically at bedtime. 45 g 2  . ALPRAZolam (XANAX) 0.25 MG tablet Take 0.25 mg by mouth at bedtime as needed for sleep.    Marland Kitchen doxycycline (VIBRA-TABS) 100 MG tablet Take 1 tablet by mouth 2 (two) times daily.  2  . Omega-3 Fatty Acids (FISH OIL) 500 MG CAPS Take 500 mg by mouth daily.    . Prenatal Vit-Fe Fumarate-FA (PRENATAL MULTIVITAMIN) TABS tablet Take 1 tablet by mouth daily at 12 noon.    . valACYclovir (VALTREX) 1000 MG tablet TAKE 1 TABLET BY MOUTH EVERY DAY 90 tablet 3  . phentermine 37.5 MG capsule Take 37.5 mg by mouth every morning.     No current  facility-administered medications for this visit.    Family History  Problem Relation Age of Onset  . Breast cancer Mother 58  . Emphysema Father   . Heart disease Father   . Cancer - Other Maternal Grandmother     brain then liver  . Lung cancer Maternal Grandfather   . Breast cancer Paternal Grandmother   . Lung cancer Paternal Grandfather     ROS:  Pertinent items are noted in HPI.  Otherwise, a comprehensive ROS was negative.  Exam:   BP 116/74 mmHg  Pulse 64  Ht 5' 5.25" (1.657 m)  Wt 175 lb (79.379 kg)  BMI 28.91 kg/m2  LMP 09/25/2014 (Exact Date) Height: 5' 5.25" (165.7 cm) Ht Readings from Last 3 Encounters:  09/29/14 5' 5.25" (1.657 m)  11/29/13 5' 5.25" (1.657 m)  11/11/13 5' 5.25" (1.657 m)    General appearance: alert, cooperative and appears stated age Head: Normocephalic, without obvious abnormality, atraumatic Neck: no adenopathy, supple, symmetrical, trachea midline and thyroid normal to inspection and palpation Lungs: clear to auscultation bilaterally Breasts: normal appearance, no masses or tenderness Heart: regular rate and rhythm Abdomen: soft, non-tender; no masses,  no organomegaly Extremities: extremities normal, atraumatic, no cyanosis or edema Skin:  Skin color, texture, turgor normal. No rashes or lesions Lymph nodes: Cervical, supraclavicular, and axillary nodes normal. No abnormal inguinal nodes palpated Neurologic: Grossly normal   Pelvic: External genitalia:  no lesions              Urethra:  normal appearing urethra with no masses, tenderness or lesions              Bartholin's and Skene's: normal                 Vagina: normal appearing vagina with normal color and discharge, no lesions              Cervix: anteverted              Pap taken: Yes.   Bimanual Exam:  Uterus:  normal size, contour, position, consistency, mobility, non-tender              Adnexa: no mass, fullness, tenderness               Rectovaginal: Confirms                Anus:  normal sphincter tone, no lesions  Chaperone present: yes  A:  Well Woman with normal exam  Contraception currently with condoms  Wants to have Mirena IUD to avoid menses Acne   P:   Reviewed health and wellness pertinent to exam  Pap smear taken today  Order placed for Mirena IUD but realizes the other option is Christean Grief IUD if needed.  Aware of need for consult.  Follow with labs - STD and pap.  Counseled on breast self exam, STD prevention, adequate intake of calcium and vitamin D, diet and exercise return annually or prn  An After Visit Summary was printed and given to the patient.

## 2014-09-30 ENCOUNTER — Telehealth: Payer: Self-pay | Admitting: Nurse Practitioner

## 2014-09-30 LAB — STD PANEL
HIV 1&2 Ab, 4th Generation: NONREACTIVE
Hepatitis B Surface Ag: NEGATIVE

## 2014-09-30 LAB — HEMOGLOBIN, FINGERSTICK: Hemoglobin, fingerstick: 12.7 g/dL (ref 12.0–16.0)

## 2014-09-30 NOTE — Telephone Encounter (Signed)
Spoke with patient. Advised of benefit quote received for IUD insertion. Patient agreeable. Patient will call within the first 5 days of cycle to schedule the insertion.

## 2014-10-01 LAB — IPS N GONORRHOEA AND CHLAMYDIA BY PCR

## 2014-10-02 NOTE — Progress Notes (Signed)
Encounter reviewed by Dr. Brook Silva.  

## 2014-10-03 LAB — IPS PAP TEST WITH REFLEX TO HPV

## 2014-10-23 ENCOUNTER — Telehealth: Payer: Self-pay | Admitting: Nurse Practitioner

## 2014-10-23 NOTE — Telephone Encounter (Signed)
Pt calling to schedule iud insertion. Previously discussed with Patty and she is now ready to schedule.

## 2014-10-23 NOTE — Telephone Encounter (Signed)
Patient will need consultation with a physician provider.  We can discuss pretreatment at that time.

## 2014-10-23 NOTE — Telephone Encounter (Signed)
Spoke with patient and advised of message from Dr. Edward Jolly regarding need for IUD Consult. Patient agreeable.  Appointment scheduled for consult with Dr. Edward Jolly 11/10/14. Routing to provider for final review. Patient agreeable to disposition. Will close encounter

## 2014-10-23 NOTE — Telephone Encounter (Signed)
Spoke with patient. She states she is ready to schedule IUD insertion.  I advised patient that she will need consult with MD prior to insertion. Patient feels that this is not the information that was given to her when speaking with Lauro Franklin, FNP. Patient states she feels "I might need a Valium" as well. Advised patient that is also a reason for a consult as well. Also will need cytotec prescription. Patient states she started her cycle today.  She continues to want to schedule IUD insertion. Since patient is not agreeable to disposition of MD consult by this RN. Advised would route message to Dr. Edward Jolly to review and advise if can schedule without consult. Patient agreeable.

## 2014-11-10 ENCOUNTER — Ambulatory Visit (INDEPENDENT_AMBULATORY_CARE_PROVIDER_SITE_OTHER): Payer: BC Managed Care – PPO | Admitting: Obstetrics and Gynecology

## 2014-11-10 ENCOUNTER — Encounter: Payer: Self-pay | Admitting: Obstetrics and Gynecology

## 2014-11-10 VITALS — BP 110/60 | HR 96 | Resp 18 | Ht 65.25 in | Wt 179.4 lb

## 2014-11-10 DIAGNOSIS — N760 Acute vaginitis: Secondary | ICD-10-CM | POA: Diagnosis not present

## 2014-11-10 DIAGNOSIS — Z3009 Encounter for other general counseling and advice on contraception: Secondary | ICD-10-CM

## 2014-11-10 DIAGNOSIS — Z309 Encounter for contraceptive management, unspecified: Secondary | ICD-10-CM

## 2014-11-10 NOTE — Progress Notes (Signed)
GYNECOLOGY  VISIT   HPI: 29 y.o.   Single  Caucasian  female   G0P0 with Patient's last menstrual period was 10/26/2014.   here for IUD consult Wants reliable contraception.  Menses are monthly not painful beyond the first few hours.  Flow is not heavy.   Had hair loss with Yaz.  Ended up shaving her head.  Hair has now grown back. (Is wearing a wrap scarf on head today.)  Using Monistat for 5 days currently.  Symptoms are discharge and itching.  Itching was resolved but not discharge.  No odor.  Went to [employer] and sat in her bathing suit.  No recent change in partners.  Negative STD check 09/29/14 at annual exam.   GYNECOLOGIC HISTORY: Patient's last menstrual period was 10/26/2014. Contraception: Condoms all of the time  Menopausal hormone therapy: n/a Last mammogram: n/a Last pap smear: 09/29/14 ASCUS Neg hr hpv        OB History    Gravida Para Term Preterm AB TAB SAB Ectopic Multiple Living   0                  Patient Active Problem List   Diagnosis Date Noted  . Varicose veins of lower extremities with other complications 03/11/2013    Past Medical History  Diagnosis Date  . Varicose veins   . STD (sexually transmitted disease)     HSV    Past Surgical History  Procedure Laterality Date  . Tonsillectomy  age 37    Current Outpatient Prescriptions  Medication Sig Dispense Refill  . ACZONE 5 % topical gel     . Adapalene-Benzoyl Peroxide (EPIDUO) 0.1-2.5 % gel Apply 1 application topically at bedtime. 45 g 2  . ALPRAZolam (XANAX) 0.25 MG tablet Take 0.25 mg by mouth at bedtime as needed for sleep.    Marland Kitchen doxycycline (VIBRA-TABS) 100 MG tablet Take 1 tablet by mouth 2 (two) times daily.  2  . Omega-3 Fatty Acids (FISH OIL) 500 MG CAPS Take 500 mg by mouth daily.    . phentermine 37.5 MG capsule Take 37.5 mg by mouth every morning.    . Prenatal Vit-Fe Fumarate-FA (PRENATAL MULTIVITAMIN) TABS tablet Take 1 tablet by mouth daily at 12 noon.    . valACYclovir  (VALTREX) 1000 MG tablet TAKE 1 TABLET BY MOUTH EVERY DAY 90 tablet 3   No current facility-administered medications for this visit.     ALLERGIES: Aspirin and Latex  Family History  Problem Relation Age of Onset  . Breast cancer Mother 2  . Emphysema Father   . Heart disease Father   . Cancer - Other Maternal Grandmother     brain then liver  . Lung cancer Maternal Grandfather   . Breast cancer Paternal Grandmother   . Lung cancer Paternal Grandfather     History   Social History  . Marital Status: Single    Spouse Name: N/A  . Number of Children: N/A  . Years of Education: N/A   Occupational History  . Not on file.   Social History Main Topics  . Smoking status: Never Smoker   . Smokeless tobacco: Never Used  . Alcohol Use: 0.6 oz/week    1 Standard drinks or equivalent per week  . Drug Use: No  . Sexual Activity:    Partners: Male    Birth Control/ Protection: Condom   Other Topics Concern  . Not on file   Social History Narrative    ROS:  Pertinent items are noted in HPI.  PHYSICAL EXAMINATION:    BP 110/60 mmHg  Pulse 96  Resp 18  Ht 5' 5.25" (1.657 m)  Wt 179 lb 6.4 oz (81.375 kg)  BMI 29.64 kg/m2  LMP 10/26/2014    General appearance: alert, cooperative and appears stated age   Pelvic: External genitalia:  no lesions              Urethra:  normal appearing urethra with no masses, tenderness or lesions              Bartholins and Skenes: normal                 Vagina: normal appearing vagina with normal color and discharge, no lesions              Cervix: no lesions               Bimanual Exam:  Uterus:  normal size, contour, position, consistency, mobility, non-tender              Adnexa: normal adnexa and no mass, fullness, tenderness                Chaperone was present for exam.  ASSESSMENT  Alopecia with Yaz. Desire for long acting contraception.  Vaginitis.   PLAN  Counseled regarding birth control options - pills, patch,  ring, Depo Provera, Nexplanon, IUDs. Concentrated discussion on IUDs - [place], Mirena, and [address].  Risks and benefits of each reviewed.  In light of patient's alopecia with Yaz, I did guide her toward the ParaGard which is hormone free. Patient agrees and will precert this.  Return with menses for insertion.  Will plan for Cytotec and paracervical block. Discussed condoms for STD prevention.  Affirm performed.      An After Visit Summary was printed and given to the patient.  ___15___ minutes face to face time of which over 50% was spent in counseling.

## 2014-11-11 ENCOUNTER — Telehealth: Payer: Self-pay | Admitting: Obstetrics and Gynecology

## 2014-11-11 LAB — WET PREP BY MOLECULAR PROBE
Candida species: NEGATIVE
GARDNERELLA VAGINALIS: NEGATIVE
TRICHOMONAS VAG: NEGATIVE

## 2014-11-11 NOTE — Telephone Encounter (Signed)
Left message for patient to call back. Need to go over benefits for [facility] IUD.

## 2014-11-11 NOTE — Telephone Encounter (Signed)
Advised patient that all iud devices are covered at 100% of allowable. Patient is to call with cycle for insertion. Patient agreeable.

## 2014-11-13 ENCOUNTER — Encounter: Payer: Self-pay | Admitting: Obstetrics and Gynecology

## 2014-12-10 ENCOUNTER — Telehealth: Payer: Self-pay | Admitting: Nurse Practitioner

## 2014-12-10 NOTE — Telephone Encounter (Signed)
Spoke with patient. Patient states she is pregnant and concerned about continuation of certain medications. LMP was 10/24/2014. Has had positive UPT. Scheduled with OB for next Tuesday 6/21. 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 Has had a cold sore for the last month. Has taking Valtrex 1g x3 days with no relief then took 1g x10 days which helped for a few days. Now cold sore is back. Patient is unsure what to do and if Valtrex is safe to take with pregnancy. Patient was taking Doxycycline for her acne but stopped taking this. "My acne is terrible now." Has Epiduo that she uses topically and would like to know if this is safe to continue. Has also called dermatologist and is waiting on return call. Advised will speak with covering provider and return call with further recommendations. Patient is agreeable.

## 2014-12-10 NOTE — Telephone Encounter (Signed)
Patient has a medication question. 

## 2014-12-10 NOTE — Telephone Encounter (Signed)
She should stop all those medications not recommended in pregnancy especially first trimester.

## 2014-12-11 NOTE — Telephone Encounter (Addendum)
Left message to call Kaitlyn at [phone].  Advise patient she may use topical hydrocortisone ointment or lysine ointment for cold sore per Verner Chol CNM.

## 2014-12-16 NOTE — Telephone Encounter (Signed)
Left message to call Kaitlyn at [phone]. 

## 2014-12-18 NOTE — Telephone Encounter (Signed)
Spoke with patient. Advised of message as seen below from Verner Chol CNM. Patient is agreeable and verbalizes understanding. Patient was seen with OB on 6/21 and will continue care with their practice until after delivery.  Routing to provider for final review. Patient agreeable to disposition. Will close encounter.

## 2014-12-25 LAB — OB RESULTS CONSOLE GC/CHLAMYDIA
CHLAMYDIA, DNA PROBE: NEGATIVE
Gonorrhea: NEGATIVE

## 2014-12-25 LAB — OB RESULTS CONSOLE ANTIBODY SCREEN: ANTIBODY SCREEN: NEGATIVE

## 2014-12-25 LAB — OB RESULTS CONSOLE ABO/RH: RH Type: POSITIVE

## 2014-12-25 LAB — OB RESULTS CONSOLE HIV ANTIBODY (ROUTINE TESTING): HIV: NONREACTIVE

## 2014-12-25 LAB — OB RESULTS CONSOLE HEPATITIS B SURFACE ANTIGEN: HEP B S AG: NEGATIVE

## 2014-12-25 LAB — OB RESULTS CONSOLE RPR: RPR: NONREACTIVE

## 2014-12-25 LAB — OB RESULTS CONSOLE RUBELLA ANTIBODY, IGM: RUBELLA: IMMUNE

## 2015-07-02 LAB — OB RESULTS CONSOLE GBS: GBS: NEGATIVE

## 2015-07-05 ENCOUNTER — Encounter (HOSPITAL_COMMUNITY): Payer: Self-pay

## 2015-07-05 ENCOUNTER — Emergency Department (HOSPITAL_COMMUNITY): Payer: BC Managed Care – PPO

## 2015-07-05 ENCOUNTER — Emergency Department (HOSPITAL_COMMUNITY)
Admission: EM | Admit: 2015-07-05 | Discharge: 2015-07-05 | Disposition: A | Discharge status: Home / Self Care | Payer: BC Managed Care – PPO | Attending: Emergency Medicine | Admitting: Emergency Medicine

## 2015-07-05 DIAGNOSIS — Y9389 Activity, other specified: Secondary | ICD-10-CM | POA: Diagnosis not present

## 2015-07-05 DIAGNOSIS — M25562 Pain in left knee: Secondary | ICD-10-CM

## 2015-07-05 DIAGNOSIS — Y998 Other external cause status: Secondary | ICD-10-CM | POA: Diagnosis not present

## 2015-07-05 DIAGNOSIS — Z79899 Other long term (current) drug therapy: Secondary | ICD-10-CM | POA: Insufficient documentation

## 2015-07-05 DIAGNOSIS — Z91040 Latex allergy status: Secondary | ICD-10-CM | POA: Insufficient documentation

## 2015-07-05 DIAGNOSIS — Z792 Long term (current) use of antibiotics: Secondary | ICD-10-CM | POA: Insufficient documentation

## 2015-07-05 DIAGNOSIS — S8992XA Unspecified injury of left lower leg, initial encounter: Secondary | ICD-10-CM | POA: Insufficient documentation

## 2015-07-05 DIAGNOSIS — Y9289 Other specified places as the place of occurrence of the external cause: Secondary | ICD-10-CM | POA: Diagnosis not present

## 2015-07-05 DIAGNOSIS — W000XXA Fall on same level due to ice and snow, initial encounter: Secondary | ICD-10-CM | POA: Insufficient documentation

## 2015-07-05 DIAGNOSIS — Z8719 Personal history of other diseases of the digestive system: Secondary | ICD-10-CM | POA: Diagnosis not present

## 2015-07-05 DIAGNOSIS — S70211A Abrasion, right hip, initial encounter: Secondary | ICD-10-CM | POA: Insufficient documentation

## 2015-07-05 DIAGNOSIS — S70212A Abrasion, left hip, initial encounter: Secondary | ICD-10-CM | POA: Insufficient documentation

## 2015-07-05 DIAGNOSIS — O9A213 Injury, poisoning and certain other consequences of external causes complicating pregnancy, third trimester: Secondary | ICD-10-CM | POA: Diagnosis present

## 2015-07-05 DIAGNOSIS — W19XXXA Unspecified fall, initial encounter: Secondary | ICD-10-CM

## 2015-07-05 HISTORY — DX: Crohn's disease, unspecified, without complications: K50.90

## 2015-07-05 NOTE — ED Notes (Signed)
Onset 30 minutes PTA pt slipped on ice, left leg went underneath self and pt fell on right hip.  [redacted] weeks pregnant, G1P0, no complications with this pregnancy,  LOV: 07-02-15, cervical exam done, no dilation, baby head down.  +fetal movement, No leading fluid, vaginal bleeding or contractions.  Pt c/o left leg pain, heard pop when fell, painful to stand on left leg.

## 2015-07-05 NOTE — Progress Notes (Signed)
Pt is a G1P0 at 36 weeks and 2 days who slipped and fell on the ice. She landed on her left knee and then fell down on her rt hip. No obvious bruising noted. Denies vaginal bleeding or leaking of fluid. Pt placed on EFM. FHR 145BPM. Denies uc's.

## 2015-07-05 NOTE — Progress Notes (Signed)
Spoke with Dr. Henderson Cloud. Pt is a G1P0 at 36 2/[redacted] weeks gestation who slipped on the ice. She fell on her left knee and then rt hip. FHR is a category 1 tracing, no uc's, vaginal bleeding or leaki ng of fluid. Pt is to be on the fetal monitor for 2 hours, if tracing continues to be category 1, pt can be obstetrically cleared and dc'd home.

## 2015-07-05 NOTE — ED Notes (Signed)
Pt stable, ambulatory, states understanding of discharge instructions 

## 2015-07-05 NOTE — ED Provider Notes (Signed)
CSN: [id number]     Arrival date & time 07/05/15  1207 History   First MD Initiated Contact with Patient 07/05/15 1243     Chief Complaint  Patient presents with  . Fall  . Leg Injury     (Consider location/radiation/quality/duration/timing/severity/associated sxs/prior Treatment) Patient is a 30 y.o. female presenting with fall. The history is provided by the patient and medical records.  Fall Associated symptoms include arthralgias.   30 year old female with history of Crohn's disease and HSV, presenting to the ED after a fall. Patient is G1P0 approx [redacted] weeks gestation.  Patient states she slipped on some ice, causing her left leg to go underneath her.  She states she felt a "pop" in her left knee, lateral aspect.  She states now she has pain shooting up and down her left leg.  Patient states she also fell onto right hip but reports that feels fine. Denies numbness or weakness of her legs. No back pain. No head injury or loss of consciousness. Patient's pregnancy thus far has been uncomplicated. She has had appropriate prenatal care. She's not had any contractions, vaginal bleeding, or loss of fluid since fall. She states baby has been moving as per normal.  Past Medical History  Diagnosis Date  . Varicose veins   . STD (sexually transmitted disease)     HSV  . Crohn disease [facility])    Past Surgical History  Procedure Laterality Date  . Tonsillectomy  age 44   Family History  Problem Relation Age of Onset  . Breast cancer Mother 70  . Emphysema Father   . Heart disease Father   . Cancer - Other Maternal Grandmother     brain then liver  . Lung cancer Maternal Grandfather   . Breast cancer Paternal Grandmother   . Lung cancer Paternal Grandfather    Social History  Substance Use Topics  . Smoking status: Never Smoker   . Smokeless tobacco: Never Used  . Alcohol Use: No   OB History    Gravida Para Term Preterm AB TAB SAB Ectopic Multiple Living   1              Review  of Systems  Musculoskeletal: Positive for arthralgias.  All other systems reviewed and are negative.     Allergies  Aspirin and Latex  Home Medications   Prior to Admission medications   Medication Sig Start Date End Date Taking? Authorizing Provider  ACZONE 5 % topical gel  10/30/14   Historical Provider, MD  Adapalene-Benzoyl Peroxide (EPIDUO) 0.1-2.5 % gel Apply 1 application topically at bedtime. 07/18/13   Ria Comment, FNP  ALPRAZolam Prudy Feeler) 0.25 MG tablet Take 0.25 mg by mouth at bedtime as needed for sleep.    Historical Provider, MD  doxycycline (VIBRA-TABS) 100 MG tablet Take 1 tablet by mouth 2 (two) times daily. 08/26/14   Historical Provider, MD  Omega-3 Fatty Acids (FISH OIL) 500 MG CAPS Take 500 mg by mouth daily.    Historical Provider, MD  phentermine 37.5 MG capsule Take 37.5 mg by mouth every morning.    Historical Provider, MD  Prenatal Vit-Fe Fumarate-FA (PRENATAL MULTIVITAMIN) TABS tablet Take 1 tablet by mouth daily at 12 noon.    Historical Provider, MD  valACYclovir (VALTREX) 1000 MG tablet TAKE 1 TABLET BY MOUTH EVERY DAY 09/29/14   Ria Comment, FNP  zolpidem (AMBIEN) 10 MG tablet Take 10 mg by mouth at bedtime as needed for sleep.    Historical Provider, MD  BP 128/79 mmHg  Pulse 96  Temp(Src) 98 F (36.7 C)  Resp 18  Ht  (1.702 m)  Wt 108.863 kg  BMI 37.58 kg/m2  SpO2 96%  LMP 10/26/2014   Physical Exam  Constitutional: She is oriented to person, place, and time. She appears well-developed and well-nourished.  HENT:  Head: Normocephalic and atraumatic.  Mouth/Throat: Oropharynx is clear and moist.  Eyes: Conjunctivae and EOM are normal. Pupils are equal, round, and reactive to light.  Neck: Normal range of motion. Neck supple.  Cardiovascular: Normal rate, regular rhythm and normal heart sounds.   Pulmonary/Chest: Effort normal and breath sounds normal. No respiratory distress. She has no wheezes.  Abdominal: Soft. Bowel sounds are normal.  There is no tenderness. There is no guarding.  Gravid abdomen  Musculoskeletal: Normal range of motion.  Abrasion noted to bilateral hip, overall non;tender; no leg shortening or malrotation; full ROM Left knee with TTP along lateral aspect; mild swelling noted; nearly full flexion, however some pain noted, normal extension; normal ROM of left hip and ankle DP pulses intact bilaterally, normal strength and sensation to both legs  Neurological: She is alert and oriented to person, place, and time.  Skin: Skin is warm and dry.  Psychiatric: She has a normal mood and affect.  Nursing note and vitals reviewed.   ED Course  Procedures (including critical care time) Labs Review Labs Reviewed - No data to display  Imaging Review Dg Knee Complete 4 Views Left  07/05/2015  CLINICAL DATA:  Patient status post fall on ice. Left leg pain. Initial encounter. EXAM: LEFT KNEE - COMPLETE 4+ VIEW COMPARISON:  None. FINDINGS: Normal anatomic alignment. No evidence for acute fracture or dislocation. Regional soft tissues are unremarkable. IMPRESSION: No acute osseous abnormality. Electronically Signed   By: Annia Belt M.D.   On: 07/05/2015 14:11   I have personally reviewed and evaluated these images and lab results as part of my medical decision-making.   EKG Interpretation None      MDM   Final diagnoses:  Fall, initial encounter  Left knee pain   30 year old G1P0 approx [redacted] weeks gestation presenting to the ED after a fall.  She slipped on ice, landed with his left leg bent underneath her and then fell onto right hip. She probably has pain of the lateral left knee with mild swelling noted.  She has no bony deformities of either leg.  She has no leg shortening or malrotation.  Right hip has a small abrasion but is overall non-tender. Both of her lower extremities remain neurovascularly intact. She is ambulatory, but limping slightly with left leg.  No abdominal pain, vaginal bleeding, or loss of  fluid.  X-ray of left knee is negative for bony findings.  Knee sleeve was placed for support.  Patient was monitored for 2+ hours on tocometer, no acute events.  Continues to deny and abdominal pain or other symptoms. Patient stable for discharge.  She was given orthopedic follow-up if needed for knee, however i have advised her that her options for intervention (if needed) would be very limited until delivery-- she acknowledged understanding.  Discussed plan with patient, he/she acknowledged understanding and agreed with plan of care.  Return precautions given for new or worsening symptoms.  Garlon Hatchet, PA-C 07/05/15 1734  Geoffery Lyons, MD 07/06/15 [phone]

## 2015-07-05 NOTE — Discharge Instructions (Signed)
Your x-ray of your knee today was normal. You did not have any signs of contractions or fetal distress during monitoring. You may follow-up with orthopedics if continue having issues with your knee.  As we discussed, you will have limited options of intervention until after delivery. Return here for new concerns.

## 2015-07-28 ENCOUNTER — Telehealth (HOSPITAL_COMMUNITY): Payer: Self-pay | Admitting: *Deleted

## 2015-07-28 ENCOUNTER — Encounter (HOSPITAL_COMMUNITY): Payer: Self-pay | Admitting: *Deleted

## 2015-07-28 NOTE — Telephone Encounter (Signed)
Preadmission screen  

## 2015-08-03 ENCOUNTER — Telehealth (HOSPITAL_COMMUNITY): Payer: Self-pay | Admitting: *Deleted

## 2015-08-03 NOTE — Telephone Encounter (Signed)
Preadmission screen  

## 2015-08-04 ENCOUNTER — Inpatient Hospital Stay (HOSPITAL_COMMUNITY): Payer: BC Managed Care – PPO | Admitting: Anesthesiology

## 2015-08-04 ENCOUNTER — Inpatient Hospital Stay (HOSPITAL_COMMUNITY)
Admission: RE | Admit: 2015-08-04 | Discharge: 2015-08-06 | DRG: 774 | Disposition: A | Discharge status: Home / Self Care | Payer: BC Managed Care – PPO | Source: Ambulatory Visit | Attending: Obstetrics and Gynecology | Admitting: Obstetrics and Gynecology

## 2015-08-04 ENCOUNTER — Encounter (HOSPITAL_COMMUNITY): Payer: Self-pay

## 2015-08-04 DIAGNOSIS — O26893 Other specified pregnancy related conditions, third trimester: Secondary | ICD-10-CM | POA: Diagnosis present

## 2015-08-04 DIAGNOSIS — O9962 Diseases of the digestive system complicating childbirth: Secondary | ICD-10-CM | POA: Diagnosis present

## 2015-08-04 DIAGNOSIS — Z3A40 40 weeks gestation of pregnancy: Secondary | ICD-10-CM | POA: Diagnosis not present

## 2015-08-04 DIAGNOSIS — K5090 Crohn's disease, unspecified, without complications: Secondary | ICD-10-CM | POA: Diagnosis present

## 2015-08-04 DIAGNOSIS — A6000 Herpesviral infection of urogenital system, unspecified: Secondary | ICD-10-CM | POA: Diagnosis present

## 2015-08-04 DIAGNOSIS — Z6839 Body mass index (BMI) 39.0-39.9, adult: Secondary | ICD-10-CM | POA: Diagnosis not present

## 2015-08-04 DIAGNOSIS — O99214 Obesity complicating childbirth: Secondary | ICD-10-CM | POA: Diagnosis present

## 2015-08-04 DIAGNOSIS — Z833 Family history of diabetes mellitus: Secondary | ICD-10-CM | POA: Diagnosis not present

## 2015-08-04 DIAGNOSIS — O9832 Other infections with a predominantly sexual mode of transmission complicating childbirth: Secondary | ICD-10-CM | POA: Diagnosis present

## 2015-08-04 DIAGNOSIS — Z8249 Family history of ischemic heart disease and other diseases of the circulatory system: Secondary | ICD-10-CM | POA: Diagnosis not present

## 2015-08-04 DIAGNOSIS — O480 Post-term pregnancy: Secondary | ICD-10-CM | POA: Diagnosis present

## 2015-08-04 LAB — ABO/RH: ABO/RH(D): A POS

## 2015-08-04 LAB — TYPE AND SCREEN
ABO/RH(D): A POS
Antibody Screen: NEGATIVE

## 2015-08-04 LAB — CBC
HEMATOCRIT: 33.6 % — AB (ref 36.0–46.0)
Hemoglobin: 11.1 g/dL — ABNORMAL LOW (ref 12.0–15.0)
MCH: 29.6 pg (ref 26.0–34.0)
MCHC: 33 g/dL (ref 30.0–36.0)
MCV: 89.6 fL (ref 78.0–100.0)
Platelets: 223 10*3/uL (ref 150–400)
RBC: 3.75 MIL/uL — AB (ref 3.87–5.11)
RDW: 15.4 % (ref 11.5–15.5)
WBC: 9.4 10*3/uL (ref 4.0–10.5)

## 2015-08-04 LAB — RPR: RPR Ser Ql: NONREACTIVE

## 2015-08-04 MED ORDER — MISOPROSTOL 25 MCG QUARTER TABLET
25.0000 ug | ORAL_TABLET | ORAL | Status: DC | PRN
  Administered 2015-08-04: 25 ug via VAGINAL
  Filled 2015-08-04 (×2): qty 0.25

## 2015-08-04 MED ORDER — SENNOSIDES-DOCUSATE SODIUM 8.6-50 MG PO TABS
2.0000 | ORAL_TABLET | ORAL | Status: DC
  Administered 2015-08-05 (×2): 2 via ORAL
  Filled 2015-08-04 (×2): qty 2

## 2015-08-04 MED ORDER — BENZOCAINE-MENTHOL 20-0.5 % EX AERO
1.0000 "application " | INHALATION_SPRAY | CUTANEOUS | Status: DC | PRN
  Administered 2015-08-04: 1 via TOPICAL
  Filled 2015-08-04: qty 56

## 2015-08-04 MED ORDER — METHYLERGONOVINE MALEATE 0.2 MG/ML IJ SOLN
INTRAMUSCULAR | Status: AC
  Administered 2015-08-04: 0.2 mg
  Filled 2015-08-04: qty 1

## 2015-08-04 MED ORDER — DIPHENHYDRAMINE HCL 25 MG PO CAPS: 25.0000 mg | ORAL_CAPSULE | Freq: Four times a day (QID) | ORAL | Status: DC | PRN

## 2015-08-04 MED ORDER — ZOLPIDEM TARTRATE 5 MG PO TABS: 5.0000 mg | ORAL_TABLET | Freq: Every evening | ORAL | Status: DC | PRN

## 2015-08-04 MED ORDER — PRENATAL MULTIVITAMIN CH
1.0000 | ORAL_TABLET | Freq: Every day | ORAL | Status: DC
  Administered 2015-08-05 – 2015-08-06 (×2): 1 via ORAL
  Filled 2015-08-04 (×2): qty 1

## 2015-08-04 MED ORDER — LIDOCAINE HCL (PF) 1 % IJ SOLN
30.0000 mL | INTRAMUSCULAR | Status: DC | PRN
  Filled 2015-08-04: qty 30

## 2015-08-04 MED ORDER — SIMETHICONE 80 MG PO CHEW: 80.0000 mg | CHEWABLE_TABLET | ORAL | Status: DC | PRN

## 2015-08-04 MED ORDER — PHENYLEPHRINE 40 MCG/ML (10ML) SYRINGE FOR IV PUSH (FOR BLOOD PRESSURE SUPPORT)
80.0000 ug | PREFILLED_SYRINGE | INTRAVENOUS | Status: DC | PRN
  Filled 2015-08-04: qty 20

## 2015-08-04 MED ORDER — OXYTOCIN 10 UNIT/ML IJ SOLN
1.0000 m[IU]/min | INTRAVENOUS | Status: DC
  Administered 2015-08-04: 2 m[IU]/min via INTRAVENOUS

## 2015-08-04 MED ORDER — FLEET ENEMA 7-19 GM/118ML RE ENEM: 1.0000 | ENEMA | RECTAL | Status: DC | PRN

## 2015-08-04 MED ORDER — WITCH HAZEL-GLYCERIN EX PADS: 1.0000 "application " | MEDICATED_PAD | CUTANEOUS | Status: DC | PRN

## 2015-08-04 MED ORDER — ONDANSETRON HCL 4 MG/2ML IJ SOLN: 4.0000 mg | INTRAMUSCULAR | Status: DC | PRN

## 2015-08-04 MED ORDER — OXYTOCIN BOLUS FROM INFUSION
500.0000 mL | INTRAVENOUS | Status: DC
  Administered 2015-08-04: 500 mL via INTRAVENOUS

## 2015-08-04 MED ORDER — FENTANYL 2.5 MCG/ML BUPIVACAINE 1/10 % EPIDURAL INFUSION (WH - ANES)
14.0000 mL/h | INTRAMUSCULAR | Status: DC | PRN
  Administered 2015-08-04 (×2): 14 mL/h via EPIDURAL
  Filled 2015-08-04 (×2): qty 125

## 2015-08-04 MED ORDER — OXYCODONE-ACETAMINOPHEN 5-325 MG PO TABS
1.0000 | ORAL_TABLET | ORAL | Status: DC | PRN
  Administered 2015-08-05: 2 via ORAL

## 2015-08-04 MED ORDER — TERBUTALINE SULFATE 1 MG/ML IJ SOLN: 0.2500 mg | Freq: Once | INTRAMUSCULAR | Status: DC | PRN

## 2015-08-04 MED ORDER — LACTATED RINGERS IV SOLN: 500.0000 mL | INTRAVENOUS | Status: DC | PRN

## 2015-08-04 MED ORDER — ONDANSETRON HCL 4 MG/2ML IJ SOLN: 4.0000 mg | Freq: Four times a day (QID) | INTRAMUSCULAR | Status: DC | PRN

## 2015-08-04 MED ORDER — OXYCODONE-ACETAMINOPHEN 5-325 MG PO TABS: 1.0000 | ORAL_TABLET | ORAL | Status: DC | PRN

## 2015-08-04 MED ORDER — EPHEDRINE 5 MG/ML INJ: 10.0000 mg | INTRAVENOUS | Status: DC | PRN

## 2015-08-04 MED ORDER — LANOLIN HYDROUS EX OINT: TOPICAL_OINTMENT | CUTANEOUS | Status: DC | PRN

## 2015-08-04 MED ORDER — ACETAMINOPHEN 325 MG PO TABS: 650.0000 mg | ORAL_TABLET | ORAL | Status: DC | PRN

## 2015-08-04 MED ORDER — TETANUS-DIPHTH-ACELL PERTUSSIS 5-2.5-18.5 LF-MCG/0.5 IM SUSP: 0.5000 mL | Freq: Once | INTRAMUSCULAR | Status: DC

## 2015-08-04 MED ORDER — OXYTOCIN 10 UNIT/ML IJ SOLN
2.5000 [IU]/h | INTRAVENOUS | Status: DC
  Filled 2015-08-04: qty 4

## 2015-08-04 MED ORDER — DIPHENHYDRAMINE HCL 50 MG/ML IJ SOLN: 12.5000 mg | INTRAMUSCULAR | Status: DC | PRN

## 2015-08-04 MED ORDER — DIBUCAINE 1 % RE OINT: 1.0000 "application " | TOPICAL_OINTMENT | RECTAL | Status: DC | PRN

## 2015-08-04 MED ORDER — OXYCODONE-ACETAMINOPHEN 5-325 MG PO TABS
2.0000 | ORAL_TABLET | ORAL | Status: DC | PRN
  Administered 2015-08-04 – 2015-08-06 (×8): 2 via ORAL
  Filled 2015-08-04 (×10): qty 2

## 2015-08-04 MED ORDER — OXYCODONE-ACETAMINOPHEN 5-325 MG PO TABS: 2.0000 | ORAL_TABLET | ORAL | Status: DC | PRN

## 2015-08-04 MED ORDER — LIDOCAINE HCL (PF) 1 % IJ SOLN
INTRAMUSCULAR | Status: DC | PRN
  Administered 2015-08-04: 9 mL via EPIDURAL
  Administered 2015-08-04: 7 mL via EPIDURAL

## 2015-08-04 MED ORDER — ONDANSETRON HCL 4 MG PO TABS: 4.0000 mg | ORAL_TABLET | ORAL | Status: DC | PRN

## 2015-08-04 MED ORDER — CITRIC ACID-SODIUM CITRATE 334-500 MG/5ML PO SOLN: 30.0000 mL | ORAL | Status: DC | PRN

## 2015-08-04 MED ORDER — LACTATED RINGERS IV SOLN
INTRAVENOUS | Status: DC
Start: 2015-08-04 — End: 2015-08-04
  Administered 2015-08-04: 10:00:00 via INTRAVENOUS
  Administered 2015-08-04: 1000 mL via INTRAVENOUS
  Administered 2015-08-04: 16:00:00 via INTRAVENOUS

## 2015-08-04 NOTE — Progress Notes (Signed)
Operative Delivery Note At 4:32 PM a viable female was delivered via Vaginal, Vacuum [employer] banker, operational).  Presentation: vertex; Position: Left,, Right,, Transverse; Station: +4.  Verbal consent: obtained from patient.  Risks and benefits discussed in detail.  Risks include, but are not limited to the risks of anesthesia, bleeding, infection, damage to maternal tissues, fetal cephalhematoma.  There is also the risk of inability to effect vaginal delivery of the head, or shoulder dystocia that cannot be resolved by established maneuvers, leading to the need for emergency cesarean section.  Gentle two finger traction on VE over 2 UCs, no pop offs  APGAR: , ; weight  .   Placenta status: Intact, Spontaneous.   Cord: 3 vessels with the following complications: None.  Cord pH: not done Methergine IM x 1  Anesthesia: Epidural  Instruments: bell VE Episiotomy: None Lacerations: 2nd degree;Perineal;Sulcus-left repaired Suture Repair: 2.0 vicryl rapide Est. Blood Loss (mL):  500  Mom to postpartum.  Baby to Couplet care / Skin to Skin.  Krysten Veronica II,Paula Busenbark E 08/04/2015, 4:49 PM

## 2015-08-04 NOTE — Plan of Care (Signed)
Problem: Urinary Elimination: Goal: Ability to reestablish a normal urinary elimination pattern will improve Outcome: Progressing Attempt x 1

## 2015-08-04 NOTE — Lactation Note (Signed)
This note was copied from the chart of Chelsey Alvarado. Lactation Consultation Note Initial visit at 4 hours of age.  Mom reports a short feeding after delivery and unsure if baby got anything.  Mom reports seeing colostrum a weeks ago.  Mom reports good changes in breast during pregnancy. LC assisted with hand expression of a few drops of colostrum.  Baby licked with few sucks, but did not maintain a good latch. RN at bedside to give Hep B while STS.  Baby remains STS.  [facility] LC resources given and discussed.  Encouraged to feed with early cues on demand.  Early newborn behavior discussed.    Mom to call for assist as needed.     Patient Name: Chelsey Alvarado HUTML'Y Date: 08/04/2015 Reason for consult: Initial assessment   Maternal Data Has patient been taught Hand Expression?: Yes Does the patient have breastfeeding experience prior to this delivery?: No  Feeding Feeding Type: Breast Fed Length of feed:  (few sucks)  LATCH Score/Interventions Latch: Repeated attempts needed to sustain latch, nipple held in mouth throughout feeding, stimulation needed to elicit sucking reflex. Intervention(s): Adjust position;Assist with latch;Breast massage;Breast compression  Audible Swallowing: None Intervention(s): Skin to skin;Hand expression;Alternate breast massage  Type of Nipple: Everted at rest and after stimulation  Comfort (Breast/Nipple): Soft / non-tender     Hold (Positioning): Assistance needed to correctly position infant at breast and maintain latch. Intervention(s): Breastfeeding basics reviewed;Support Pillows;Position options;Skin to skin  LATCH Score: 6  Lactation Tools Discussed/Used     Consult Status Consult Status: Follow-up Date: 08/05/15 Follow-up type: In-patient    Alyssia Heese, Arvella Merles 08/04/2015, 8:55 PM

## 2015-08-04 NOTE — Progress Notes (Signed)
Cx C/C/0 (caput at +1)/ROP FHT cat one

## 2015-08-04 NOTE — Anesthesia Procedure Notes (Addendum)
Epidural Patient location during procedure: OB Start time: 08/04/2015 8:08 AM End time: 08/04/2015 8:12 AM  Staffing Anesthesiologist: Leilani Able Performed by: anesthesiologist   Preanesthetic Checklist Completed: patient identified, surgical consent, pre-op evaluation, timeout performed, IV checked, risks and benefits discussed and monitors and equipment checked  Epidural Patient position: sitting Prep: site prepped and draped and DuraPrep Patient monitoring: continuous pulse ox and blood pressure Approach: midline Location: L3-L4 Injection technique: LOR air  Needle:  Needle type: Tuohy  Needle gauge: 17 G Needle length: 9 cm and 9 Needle insertion depth: 7 cm Catheter type: closed end flexible Catheter size: 19 Gauge Catheter at skin depth: 12 cm Test dose: negative and Other  Assessment Sensory level: T9 Events: blood not aspirated, injection not painful, no injection resistance, negative IV test and no paresthesia  Additional Notes Pt states her pain was 7-8 prior to epidural and she wants it to be 3-4 afterwards. Pain score upon leaving the room was 0.Reason for block:procedure for pain

## 2015-08-04 NOTE — Anesthesia Preprocedure Evaluation (Signed)
Anesthesia Evaluation  Patient identified by MRN, date of birth, ID band Patient awake    Reviewed: Allergy & Precautions, H&P , NPO status , Patient's Chart, lab work & pertinent test results  Airway Mallampati: II  TM Distance: >3 FB Neck ROM: full    Dental no notable dental hx.    Pulmonary neg pulmonary ROS,    Pulmonary exam normal breath sounds clear to auscultation       Cardiovascular negative cardio ROS Normal cardiovascular exam     Neuro/Psych    GI/Hepatic negative GI ROS, Neg liver ROS,   Endo/Other  Morbid obesity  Renal/GU negative Renal ROS     Musculoskeletal   Abdominal (+) + obese,   Peds  Hematology negative hematology ROS (+)   Anesthesia Other Findings   Reproductive/Obstetrics (+) Pregnancy                             Anesthesia Physical Anesthesia Plan  ASA: III  Anesthesia Plan: Epidural   Post-op Pain Management:    Induction:   Airway Management Planned:   Additional Equipment:   Intra-op Plan:   Post-operative Plan:   Informed Consent: I have reviewed the patients History and Physical, chart, labs and discussed the procedure including the risks, benefits and alternatives for the proposed anesthesia with the patient or authorized representative who has indicated his/her understanding and acceptance.     Plan Discussed with:   Anesthesia Plan Comments:         Anesthesia Quick Evaluation  

## 2015-08-04 NOTE — H&P (Signed)
Chelsey Alvarado is a 30 y.o. female presenting for 2 stage induction of labor last pm. S/P Cytotec now on pitocin. Pregnancy complicated by history of HSV on valtrex. No prodromal sxs. Maternal Medical History:  Fetal activity: Perceived fetal activity is normal.      OB History    Gravida Para Term Preterm AB TAB SAB Ectopic Multiple Living   1              Past Medical History  Diagnosis Date  . Varicose veins   . STD (sexually transmitted disease)     HSV  . Crohn disease (HCC)   . HSV (herpes simplex virus) anogenital infection   . Hx of varicella   . Vaginal Pap smear, abnormal   . Anxiety   . Headache    Past Surgical History  Procedure Laterality Date  . Tonsillectomy  age 47  . Ankle surgery      R    Family History: family history includes Breast cancer in her paternal grandmother; Breast cancer (age of onset: 53) in her mother; COPD in her father; Cancer in her maternal grandfather, maternal grandmother, and paternal grandfather; Cancer - Other in her maternal grandmother; Diabetes in her maternal grandfather, maternal grandmother, paternal grandfather, and paternal grandmother; Emphysema in her father; Heart disease in her father; Hypothyroidism in her mother; Lung cancer in her maternal grandfather and paternal grandfather; Varicose Veins in her mother. Social History:  reports that she has never smoked. She has never used smokeless tobacco. She reports that she does not drink alcohol or use illicit drugs.   Prenatal Transfer Tool  Maternal Diabetes: No Genetic Screening: Normal Maternal Ultrasounds/Referrals: Normal Fetal Ultrasounds or other Referrals:  None Maternal Substance Abuse:  No Significant Maternal Medications:  None Significant Maternal Lab Results:  None Other Comments:  None  Review of Systems  Eyes: Negative for blurred vision.  Gastrointestinal: Negative for abdominal pain.  Neurological: Negative for headaches.    Dilation: 4 Effacement  (%): 70 Station: -2 Exam by:: foley,rn Blood pressure 106/52, pulse 64, temperature 98.1 F (36.7 C), temperature source Oral, resp. rate 16, height 5\' 7"  (1.702 m), weight 250 lb (113.399 kg), last menstrual period 10/24/2014, SpO2 99 %. Maternal Exam:  Uterine Assessment: Contraction strength is moderate.  Contraction frequency is irregular.   Abdomen: Fetal presentation: vertex     Fetal Exam Fetal State Assessment: Category I - tracings are normal.     Physical Exam  Cardiovascular: Normal rate.   Respiratory: Effort normal.  GI: Soft.  Neurological: She has normal reflexes.    Vulva with small hemangioma on left labia minora. No lesions suspicious for HSV. Cx 4/80/-2/vtx AROM clear  Prenatal labs: ABO, Rh: --/--/A POS, A POS (02/07 0200) Antibody: NEG (02/07 0200) Rubella: Immune (06/30 0000) RPR: Nonreactive (06/30 0000)  HBsAg: Negative (06/30 0000)  HIV: Non-reactive (06/30 0000)  GBS: Negative (01/05 0000)   Assessment/Plan: 30 yo G1P0 @ 40 4/7 weeks for IIOL D/W patient.  All questions answered   Chelsey Alvarado II,Chelsey Alvarado E 08/04/2015, 10:16 AM

## 2015-08-05 LAB — CBC
HCT: 28.6 % — ABNORMAL LOW (ref 36.0–46.0)
Hemoglobin: 9.3 g/dL — ABNORMAL LOW (ref 12.0–15.0)
MCH: 29.2 pg (ref 26.0–34.0)
MCHC: 32.5 g/dL (ref 30.0–36.0)
MCV: 89.7 fL (ref 78.0–100.0)
PLATELETS: 200 10*3/uL (ref 150–400)
RBC: 3.19 MIL/uL — ABNORMAL LOW (ref 3.87–5.11)
RDW: 15.5 % (ref 11.5–15.5)
WBC: 11.1 10*3/uL — AB (ref 4.0–10.5)

## 2015-08-05 NOTE — Progress Notes (Signed)
Post Partum Day 1 Subjective: no complaints, up ad lib, voiding, tolerating PO and + flatus  Objective: Blood pressure 91/77, pulse 76, temperature 97.5 F (36.4 C), temperature source Oral, resp. rate 20, height 5\' 7"  (1.702 m), weight 250 lb (113.399 kg), last menstrual period 10/24/2014, SpO2 99 %, unknown if currently breastfeeding.  Physical Exam:  General: alert and cooperative Lochia: appropriate Uterine Fundus: firm Incision: healing well DVT Evaluation: No evidence of DVT seen on physical exam. Negative Homan's sign. No cords or calf tenderness. Calf/Ankle edema is present.   Recent Labs  08/04/15 0247 08/05/15 0541  HGB 11.1* 9.3*  HCT 33.6* 28.6*    Assessment/Plan: Plan for discharge tomorrow and Circumcision prior to discharge   LOS: 1 day   CURTIS,CAROL G 08/05/2015, 7:58 AM

## 2015-08-05 NOTE — Lactation Note (Signed)
This note was copied from a baby's chart. Lactation Consultation Note  Patient Name: Boy Robertia Jarnagin VHSJW'T Date: 08/05/2015 Reason for consult: Follow-up assessment Baby at 27 hr of life and was just circumcised at 1800. Baby was too sleepy to latch. Demonstrated manual expression, colostrum noted bilaterally, spoon in room. Discussed baby behavior, feeding frequency, waking techniques, voids, wt loss, breast changes, and nipple care. Mom is aware of OP services and support group.      Maternal Data    Feeding Feeding Type: Breast Fed Length of feed: 0 min  LATCH Score/Interventions Latch: Too sleepy or reluctant, no latch achieved, no sucking elicited. Intervention(s): Skin to skin;Teach feeding cues;Waking techniques Intervention(s): Adjust position;Assist with latch  Audible Swallowing: None Intervention(s): Skin to skin;Hand expression Intervention(s): Alternate breast massage  Type of Nipple: Everted at rest and after stimulation  Comfort (Breast/Nipple): Soft / non-tender     Hold (Positioning): Assistance needed to correctly position infant at breast and maintain latch. Intervention(s): Support Pillows;Position options  LATCH Score: 5  Lactation Tools Discussed/Used     Consult Status Consult Status: Follow-up Date: 08/06/15 Follow-up type: In-patient    Rulon Eisenmenger 08/05/2015, 7:42 PM

## 2015-08-05 NOTE — Anesthesia Postprocedure Evaluation (Signed)
Anesthesia Post Note  Patient: Chelsey Alvarado  Procedure(s) Performed: * No procedures listed *  Patient location during evaluation: Mother Baby Anesthesia Type: Epidural Level of consciousness: awake and alert Pain management: pain level not controlled Vital Signs Assessment: post-procedure vital signs reviewed and stable Respiratory status: spontaneous breathing, nonlabored ventilation and respiratory function stable Cardiovascular status: stable Postop Assessment: no headache, no backache and epidural receding Anesthetic complications: no Comments: Patient stated the labor epidural worked "ok". She stated that she pushed PCA buton too many times and got extreme shoulder pain. She is not unhappy, just stated she wished she would have known to call after one push not working. Patient stated her pain score is a 6 now and waiting on her percocet.     Last Vitals:  Filed Vitals:   08/05/15 0007 08/05/15 0523  BP: 125/72 91/77  Pulse: 79 76  Temp: 36.9 C 36.4 C  Resp: 18 20    Last Pain:  Filed Vitals:   08/05/15 0629  PainSc: Asleep                 Teshawn Moan

## 2015-08-06 MED ORDER — OXYCODONE-ACETAMINOPHEN 5-325 MG PO TABS: 1.0000 | ORAL_TABLET | ORAL | Status: DC | PRN

## 2015-08-06 NOTE — Discharge Summary (Signed)
Obstetric Discharge Summary Reason for Admission: induction of labor Prenatal Procedures: ultrasound Intrapartum Procedures: vacuum Postpartum Procedures: none Complications-Operative and Postpartum: 2 degree perineal laceration HEMOGLOBIN  Date Value Ref Range Status  08/05/2015 9.3* 12.0 - 15.0 g/dL Final   HCT  Date Value Ref Range Status  08/05/2015 28.6* 36.0 - 46.0 % Final    Physical Exam:  General: alert and cooperative Lochia: appropriate Uterine Fundus: firm Incision: healing well DVT Evaluation: No evidence of DVT seen on physical exam. Negative Homan's sign. No cords or calf tenderness. Calf/Ankle edema is present.  Discharge Diagnoses: Term Pregnancy-delivered  Discharge Information: Date: 08/06/2015 Activity: pelvic rest Diet: routine Medications: PNV and Percocet Condition: stable Instructions: refer to practice specific booklet Discharge to: home   Newborn Data: Live born female  Birth Weight: 8 lb 2 oz (3685 g) APGAR: 8, 9  Home with mother.  Chelsey Alvarado G 08/06/2015, 8:37 AM

## 2015-08-06 NOTE — Lactation Note (Signed)
This note was copied from a baby's chart. Lactation Consultation Note  Assisted mom with latching Denny Peon and keeping him engaged.  He tends to be sleepy at the breast but swallows were observed when breast compression was used. Explained to mother how to identify swallows and she verbalized understanding.  Encouraged feeding on both breasts and ensuring breast was softened after feeding. Encouraged mother to pump her breast if necessary to soften it. Advised against pacifier use and stressed the importance feeding with cues.  Denny Peon had a bowel movement during feeding and it was transitional.  Encouraged support groups and outpatient services.  Patient Name: Chelsey Alvarado [id number] Date: 08/06/2015     Maternal Data    Feeding Feeding Type: Breast Fed Length of feed: 20 min  LATCH Score/Interventions Latch: Repeated attempts needed to sustain latch, nipple held in mouth throughout feeding, stimulation needed to elicit sucking reflex. Intervention(s): Skin to skin Intervention(s): Adjust position;Assist with latch;Breast massage;Breast compression  Audible Swallowing: A few with stimulation  Type of Nipple: Everted at rest and after stimulation  Comfort (Breast/Nipple): Filling, red/small blisters or bruises, mild/mod discomfort     Hold (Positioning): Assistance needed to correctly position infant at breast and maintain latch.  LATCH Score: 6  Lactation Tools Discussed/Used     Consult Status      Soyla Dryer 08/06/2015, 12:34 PM

## 2015-10-02 ENCOUNTER — Ambulatory Visit: Payer: BC Managed Care – PPO | Admitting: Nurse Practitioner

## 2015-11-22 ENCOUNTER — Emergency Department (HOSPITAL_COMMUNITY)
Admission: EM | Admit: 2015-11-22 | Discharge: 2015-11-22 | Disposition: A | Discharge status: Home / Self Care | Payer: BC Managed Care – PPO | Attending: Emergency Medicine | Admitting: Emergency Medicine

## 2015-11-22 ENCOUNTER — Encounter (HOSPITAL_COMMUNITY): Payer: Self-pay | Admitting: *Deleted

## 2015-11-22 ENCOUNTER — Emergency Department (HOSPITAL_COMMUNITY): Payer: BC Managed Care – PPO

## 2015-11-22 DIAGNOSIS — Z79891 Long term (current) use of opiate analgesic: Secondary | ICD-10-CM | POA: Diagnosis not present

## 2015-11-22 DIAGNOSIS — Z91040 Latex allergy status: Secondary | ICD-10-CM | POA: Insufficient documentation

## 2015-11-22 DIAGNOSIS — M542 Cervicalgia: Secondary | ICD-10-CM | POA: Insufficient documentation

## 2015-11-22 DIAGNOSIS — Y999 Unspecified external cause status: Secondary | ICD-10-CM | POA: Diagnosis not present

## 2015-11-22 DIAGNOSIS — M545 Low back pain: Secondary | ICD-10-CM | POA: Diagnosis not present

## 2015-11-22 DIAGNOSIS — Y939 Activity, unspecified: Secondary | ICD-10-CM | POA: Diagnosis not present

## 2015-11-22 DIAGNOSIS — R42 Dizziness and giddiness: Secondary | ICD-10-CM | POA: Insufficient documentation

## 2015-11-22 DIAGNOSIS — Y929 Unspecified place or not applicable: Secondary | ICD-10-CM | POA: Insufficient documentation

## 2015-11-22 DIAGNOSIS — R112 Nausea with vomiting, unspecified: Secondary | ICD-10-CM | POA: Insufficient documentation

## 2015-11-22 MED ORDER — ACETAMINOPHEN 500 MG PO TABS
1000.0000 mg | ORAL_TABLET | Freq: Once | ORAL | Status: AC
  Administered 2015-11-22: 1000 mg via ORAL
  Filled 2015-11-22: qty 2
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
## 2015-11-22 NOTE — ED Notes (Signed)
Patient d/c'd self care.  Educated patient on d/c instructions.  Patient verbalized understanding.

## 2015-11-22 NOTE — Discharge Instructions (Signed)
General Assault  Assault includes any behavior or physical attack--whether it is on purpose or not--that results in injury to another person, damage to property, or both. This also includes assault that has not yet happened, but is planned to happen. Threats of assault may be physical, verbal, or written. They may be said or sent by:   Mail.   E-mail.   Text.   Social media.   Fax.  The threats may be direct, implied, or understood.  WHAT ARE THE DIFFERENT FORMS OF ASSAULT?  Forms of assault include:   Physically assaulting a person. This includes physical threats to inflict physical harm as well as:    Slapping.    Hitting.    Poking.    Kicking.    Punching.    Pushing.   Sexually assaulting a person. Sexual assault is any sexual activity that a person is forced, threatened, or coerced to participate in. It may or may not involve physical contact with the person who is assaulting you. You are sexually assaulted if you are forced to have sexual contact of any kind.   Damaging or destroying a person's assistive equipment, such as glasses, canes, or walkers.   Throwing or hitting objects.   Using or displaying a weapon to harm or threaten someone.   Using or displaying an object that appears to be a weapon in a threatening manner.   Using greater physical size or strength to intimidate someone.   Making intimidating or threatening gestures.   Bullying.   Hazing.   Using language that is intimidating, threatening, hostile, or abusive.   Stalking.   Restraining someone with force.  WHAT SHOULD I DO IF I EXPERIENCE ASSAULT?   Report assaults, threats, and stalking to the police. Call your local emergency services (911 in the [place] if you are in immediate danger or you need medical help.   You can work with a lawyer or an advocate to get legal protection against someone who has assaulted you or threatened you with assault. Protection includes restraining orders and private addresses. Crimes against  you, such as assault, can also be prosecuted through the courts. Laws will vary depending on where you live.     This information is not intended to replace advice given to you by your health care provider. Make sure you discuss any questions you have with your health care provider.     Document Released: 06/13/2005 Document Revised: 07/04/2014 Document Reviewed: 02/28/2014  Elsevier Interactive Patient Education 2016 [employer]

## 2015-11-22 NOTE — ED Notes (Signed)
Patient made aware of the need for urine.  Patient advised "I'm not pregnant".  This RN advised that need urine prior to xrays.  Patient advised that does not have to go at this time but, will try.
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
## 2015-11-24 ENCOUNTER — Ambulatory Visit (INDEPENDENT_AMBULATORY_CARE_PROVIDER_SITE_OTHER): Payer: BC Managed Care – PPO | Admitting: Family Medicine

## 2015-11-24 VITALS — BP 128/80 | HR 74 | Temp 98.2°F | Resp 17 | Ht 65.5 in | Wt 225.0 lb

## 2015-11-24 DIAGNOSIS — S060X1A Concussion with loss of consciousness of 30 minutes or less, initial encounter: Secondary | ICD-10-CM

## 2015-11-24 NOTE — Patient Instructions (Signed)
     IF you received an x-ray today, you will receive an invoice from [facility]. Please contact [facility] at [phone] with questions or concerns regarding your invoice.   IF you received labwork today, you will receive an invoice from [facility]. Please contact [facility] at [phone] with questions or concerns regarding your invoice.   Our billing staff will not be able to assist you with questions regarding bills from these companies.  You will be contacted with the lab results as soon as they are available. The fastest way to get your results is to activate your My Chart account. Instructions are located on the last page of this paperwork. If you have not heard from us regarding the results in 2 weeks, please contact this office.      
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
## 2015-11-25 ENCOUNTER — Telehealth: Payer: Self-pay

## 2015-11-25 NOTE — Telephone Encounter (Signed)
Patient was seen yesterday and did not receive a prescription for tramadol. I informed her that it will have to wait on approval

## 2015-11-26 ENCOUNTER — Telehealth: Payer: Self-pay

## 2015-11-26 NOTE — Telephone Encounter (Signed)
Pt seen by Dr Paulina Fusi. Please advise.

## 2015-11-26 NOTE — Telephone Encounter (Signed)
I do not see documentation that tramadol was planned at her visit with Dr. Paulina Fusi on 11/24/2015. Due to her breast feeling, acetaminophen and ibuprofen are the best medications for her symptoms.

## 2015-11-26 NOTE — Telephone Encounter (Signed)
Pt is checking on the status of her tramadol request   Best number [phone]

## 2015-11-27 NOTE — Telephone Encounter (Signed)
Patient returned call.  I advised her of Chelle's message to use acetaminophen and ibuprofen due to breast feeding.  She verbalized understanding.

## 2015-11-27 NOTE — Telephone Encounter (Signed)
LMVM for patient to call back

## 2015-12-22 ENCOUNTER — Telehealth: Payer: Self-pay | Admitting: *Deleted

## 2015-12-22 NOTE — Telephone Encounter (Signed)
Please contact patient in regards to 08 recall. Patient is past due  08 ASCUS with negative HR HPV, AEX 10/02/15//sp

## 2015-12-24 NOTE — Telephone Encounter (Signed)
Return call from patient.  She will continue care with [facility] for now.  She states she had a pap in mid-April at post partum visit which was normal.  Please advise recall.

## 2015-12-24 NOTE — Telephone Encounter (Signed)
Pt delivered with [facility] 08/04/15  Message left for patient to return call to schedule annual exam with pap or to let us know if she will be continuing care with current OB/GYN office.

## 2015-12-25 NOTE — Telephone Encounter (Signed)
Remove from recall due to transfer of care.

## 2015-12-30 NOTE — Telephone Encounter (Signed)
Removed from recall -eh 

## 2017-05-15 ENCOUNTER — Telehealth: Payer: Self-pay | Admitting: Obstetrics and Gynecology

## 2017-05-15 ENCOUNTER — Other Ambulatory Visit: Payer: Self-pay

## 2017-05-15 ENCOUNTER — Encounter: Payer: Self-pay | Admitting: Obstetrics and Gynecology

## 2017-05-15 ENCOUNTER — Ambulatory Visit (INDEPENDENT_AMBULATORY_CARE_PROVIDER_SITE_OTHER): Payer: BC Managed Care – PPO | Admitting: Obstetrics and Gynecology

## 2017-05-15 VITALS — BP 116/70 | HR 84 | Resp 16 | Wt 184.0 lb

## 2017-05-15 DIAGNOSIS — N898 Other specified noninflammatory disorders of vagina: Secondary | ICD-10-CM | POA: Diagnosis not present

## 2017-05-15 DIAGNOSIS — R102 Pelvic and perineal pain: Secondary | ICD-10-CM

## 2017-05-15 DIAGNOSIS — Z113 Encounter for screening for infections with a predominantly sexual mode of transmission: Secondary | ICD-10-CM | POA: Diagnosis not present

## 2017-05-15 DIAGNOSIS — R109 Unspecified abdominal pain: Secondary | ICD-10-CM

## 2017-05-15 NOTE — Telephone Encounter (Signed)
Spoke with patient. Patient requesting STD testing. States she recently found out spouse has been unfaithful for the last 6 months, is concerned.   Reports yeast like symptoms 2 days after last having intercourse with spouse. Self treated with boric acid vaginal suppositories for 10 days. Now reports symptoms of watery green vaginal d/c without odor. Menses started 05/15/17.   Recommended OV for further evaluation. Patient scheduled for OV today at 4pm with Dr. Oscar La. Patient is agreeable to date and time.   Routing to provider for final review. Patient is agreeable to disposition. Will close encounter.
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
## 2017-05-15 NOTE — Telephone Encounter (Signed)
Patient was offered an appointment to come in for std screening after finding out husband has been unfaithful. She would like to speak with nurse about coming in for blood work only today. Last seen 11/10/14.

## 2017-05-16 LAB — CBC WITH DIFFERENTIAL/PLATELET
BASOS ABS: 0 10*3/uL (ref 0.0–0.2)
Basos: 0 %
EOS (ABSOLUTE): 0 10*3/uL (ref 0.0–0.4)
Eos: 0 %
HEMOGLOBIN: 12.3 g/dL (ref 11.1–15.9)
Hematocrit: 36.8 % (ref 34.0–46.6)
IMMATURE GRANS (ABS): 0 10*3/uL (ref 0.0–0.1)
IMMATURE GRANULOCYTES: 0 %
LYMPHS: 18 %
Lymphocytes Absolute: 1.1 10*3/uL (ref 0.7–3.1)
MCH: 30 pg (ref 26.6–33.0)
MCHC: 33.4 g/dL (ref 31.5–35.7)
MCV: 90 fL (ref 79–97)
Monocytes Absolute: 0.4 10*3/uL (ref 0.1–0.9)
Monocytes: 6 %
NEUTROS PCT: 76 %
Neutrophils Absolute: 4.5 10*3/uL (ref 1.4–7.0)
PLATELETS: 286 10*3/uL (ref 150–379)
RBC: 4.1 x10E6/uL (ref 3.77–5.28)
RDW: 13.9 % (ref 12.3–15.4)
WBC: 6 10*3/uL (ref 3.4–10.8)

## 2017-05-16 LAB — HEPATITIS C ANTIBODY: Hep C Virus Ab: <0.1 s/co ratio (ref 0.0–0.9)

## 2017-05-16 LAB — GC/CHLAMYDIA PROBE AMP
CHLAMYDIA, DNA PROBE: NEGATIVE
Neisseria gonorrhoeae by PCR: NEGATIVE

## 2017-05-16 LAB — HEP, RPR, HIV PANEL
HEP B S AG: NEGATIVE
HIV Screen 4th Generation wRfx: NONREACTIVE
RPR: NONREACTIVE

## 2017-05-16 LAB — VAGINITIS/VAGINOSIS, DNA PROBE
CANDIDA SPECIES: NEGATIVE
Gardnerella vaginalis: NEGATIVE
TRICHOMONAS VAG: NEGATIVE
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
## 2021-03-07 DIAGNOSIS — Z7721 Contact with and (suspected) exposure to potentially hazardous body fluids: Secondary | ICD-10-CM | POA: Diagnosis not present

## 2024-05-14 ENCOUNTER — Other Ambulatory Visit: Payer: Self-pay | Admitting: Family

## 2024-05-14 DIAGNOSIS — Z9189 Other specified personal risk factors, not elsewhere classified: Secondary | ICD-10-CM

## 2024-05-20 ENCOUNTER — Other Ambulatory Visit: Payer: Self-pay | Admitting: Obstetrics and Gynecology

## 2024-05-20 DIAGNOSIS — R928 Other abnormal and inconclusive findings on diagnostic imaging of breast: Secondary | ICD-10-CM

## 2024-06-01 ENCOUNTER — Ambulatory Visit
Admission: RE | Admit: 2024-06-01 | Discharge: 2024-06-01 | Disposition: A | Payer: Self-pay | Source: Ambulatory Visit | Attending: Obstetrics and Gynecology | Admitting: Obstetrics and Gynecology

## 2024-06-01 DIAGNOSIS — R928 Other abnormal and inconclusive findings on diagnostic imaging of breast: Secondary | ICD-10-CM
